# Patient Record
Sex: Female | Born: 1935
Health system: Southern US, Community
[De-identification: ages and names within clinical notes are randomized; demographics above are authoritative.]

## PROBLEM LIST (undated history)

## (undated) DIAGNOSIS — I10 Essential (primary) hypertension: Secondary | ICD-10-CM

## (undated) DIAGNOSIS — E079 Disorder of thyroid, unspecified: Secondary | ICD-10-CM

## (undated) DIAGNOSIS — H269 Unspecified cataract: Secondary | ICD-10-CM

## (undated) DIAGNOSIS — J449 Chronic obstructive pulmonary disease, unspecified: Secondary | ICD-10-CM

## (undated) DIAGNOSIS — G4730 Sleep apnea, unspecified: Secondary | ICD-10-CM

## (undated) HISTORY — PX: OTHER SURGICAL HISTORY: SHX169

## (undated) HISTORY — PX: OVARIAN CYST REMOVAL: SHX89

## (undated) HISTORY — PX: ABDOMINAL HYSTERECTOMY: SHX81

## (undated) HISTORY — PX: TONSILLECTOMY: SUR1361

---

## 2001-10-27 ENCOUNTER — Encounter: Payer: Self-pay | Admitting: Family Medicine

## 2001-10-27 ENCOUNTER — Ambulatory Visit (HOSPITAL_COMMUNITY): Admission: RE | Admit: 2001-10-27 | Discharge: 2001-10-27 | Payer: Self-pay | Admitting: Family Medicine

## 2004-07-20 ENCOUNTER — Ambulatory Visit (HOSPITAL_COMMUNITY): Admission: RE | Admit: 2004-07-20 | Discharge: 2004-07-20 | Payer: Self-pay | Admitting: Family Medicine

## 2004-07-23 ENCOUNTER — Ambulatory Visit (HOSPITAL_COMMUNITY): Admission: RE | Admit: 2004-07-23 | Discharge: 2004-07-23 | Payer: Self-pay | Admitting: Family Medicine

## 2005-11-27 ENCOUNTER — Ambulatory Visit (HOSPITAL_COMMUNITY): Admission: RE | Admit: 2005-11-27 | Discharge: 2005-11-27 | Payer: Self-pay | Admitting: Family Medicine

## 2005-12-01 ENCOUNTER — Emergency Department (HOSPITAL_COMMUNITY): Admission: EM | Admit: 2005-12-01 | Discharge: 2005-12-01 | Payer: Self-pay | Admitting: Emergency Medicine

## 2005-12-16 ENCOUNTER — Ambulatory Visit: Payer: Self-pay | Admitting: Internal Medicine

## 2006-12-27 ENCOUNTER — Emergency Department (HOSPITAL_COMMUNITY): Admission: EM | Admit: 2006-12-27 | Discharge: 2006-12-27 | Payer: Self-pay | Admitting: Emergency Medicine

## 2007-05-07 ENCOUNTER — Ambulatory Visit (HOSPITAL_COMMUNITY): Admission: RE | Admit: 2007-05-07 | Discharge: 2007-05-07 | Payer: Self-pay | Admitting: Family Medicine

## 2008-04-25 ENCOUNTER — Emergency Department (HOSPITAL_COMMUNITY): Admission: EM | Admit: 2008-04-25 | Discharge: 2008-04-25 | Payer: Self-pay | Admitting: Emergency Medicine

## 2008-05-06 ENCOUNTER — Ambulatory Visit (HOSPITAL_COMMUNITY): Admission: RE | Admit: 2008-05-06 | Discharge: 2008-05-06 | Payer: Self-pay | Admitting: Family Medicine

## 2008-08-19 ENCOUNTER — Encounter (INDEPENDENT_AMBULATORY_CARE_PROVIDER_SITE_OTHER): Payer: Self-pay | Admitting: *Deleted

## 2010-07-16 LAB — COMPREHENSIVE METABOLIC PANEL
ALT: 62 U/L — ABNORMAL HIGH (ref 0–35)
AST: 47 U/L — ABNORMAL HIGH (ref 0–37)
Alkaline Phosphatase: 84 U/L (ref 39–117)
CO2: 26 mEq/L (ref 19–32)
Calcium: 9 mg/dL (ref 8.4–10.5)
Chloride: 105 mEq/L (ref 96–112)
GFR calc Af Amer: >60 mL/min (ref >60)
GFR calc non Af Amer: >60 mL/min (ref >60)
Glucose, Bld: 128 mg/dL — ABNORMAL HIGH (ref 70–99)
Potassium: 3.6 mEq/L (ref 3.5–5.1)
Sodium: 136 mEq/L (ref 135–145)
Total Bilirubin: 0.7 mg/dL (ref 0.3–1.2)

## 2010-07-16 LAB — DIFFERENTIAL
Basophils Relative: 0 % (ref 0–1)
Eosinophils Absolute: 0.1 10*3/uL (ref 0.0–0.7)
Eosinophils Relative: 1 % (ref 0–5)
Lymphs Abs: 2.1 10*3/uL (ref 0.7–4.0)
Neutrophils Relative %: 71 % (ref 43–77)

## 2010-07-16 LAB — CBC
Hemoglobin: 15.6 g/dL — ABNORMAL HIGH (ref 12.0–15.0)
MCHC: 33.4 g/dL (ref 30.0–36.0)
RBC: 5.1 MIL/uL (ref 3.87–5.11)
WBC: 9.6 10*3/uL (ref 4.0–10.5)

## 2010-07-16 LAB — D-DIMER, QUANTITATIVE: D-Dimer, Quant: 0.54 ug/mL-FEU — ABNORMAL HIGH (ref 0.00–0.48)

## 2010-08-17 NOTE — Assessment & Plan Note (Signed)
[facility]                               PULMONARY OFFICE NOTE   Stacy Franco, Stacy Franco                    MRN:          [id number]  DATE:12/16/2005                            DOB:          [birth year]    REFERRING PHYSICIAN:  Corrie Mckusick, M.D.   REASON FOR CONSULTATION:  Bronchiectasis.   HISTORY:  A 75 year old white female with a recurrent pattern of bad  bronchitis for over 25 years that tends to occur several times a year, but  typically in the fall with the weather change, associated sometimes with  itching, sneezing and watery nasal discharge, associated with sputum that  turns purulent and eliminated with a round of antibiotics.  In between  exacerbations she says she does fine, with no significant difficulty with  aerobics nor any significant chronic cough, fevers, chills, sweats, chest  pain or leg swelling, unintended weight loss.   PAST MEDICAL HISTORY:  Significant for the absence of significant sinusitis  or childhood asthma.  She does have a history of hyperlipidemia, status post  hysterectomy and appendectomy, and has also had thyroid surgery.   ALLERGIES:  SULFA, CODEINE, PENICILLIN and MORPHINE.   MEDICATIONS:  Zyrtec, Synthroid and metoprolol.   SOCIAL HISTORY:  She has never smoked.  She is retired, with no unusual  travel, pet or hobby exposure history.   FAMILY HISTORY:  Recorded in detail on the work sheet and reviewed with the  patient, and significant for the absence of respiratory diseases or atopy.   REVIEW OF SYSTEMS:  Taken in detail on the work sheet, and significant for  the problems as outlined above.   PHYSICAL EXAMINATION:  This is a pleasant, ambulatory white female with  minimal nasal tone to her voice.  Afebrile, stable vital signs.  HEENT:  Unremarkable.  Oropharynx is clear.  Nasal turbinates are normal.  Ear canals are clear bilaterally.  There was no excessive postnasal drainage  or  cobblestoning.  NECK:  Supple without cervical adenopathy or tenderness.  The trachea was  midline, no thyromegaly.  LUNGS:  Lung fields revealed few rhonchi on FVC only.  CARDIAC:  There is a regular rhythm without murmur, gallop or rub present.  ABDOMEN:  Soft, benign.  No palpable organomegaly, mass or tenderness.  EXTREMITIES:  Warm without calf tenderness, cyanosis, clubbing or edema.   CT scan of the chest was available for review from December 03, 2005.  It  indicates coarse bibasilar atelectasis with bronchiectasis, left greater  than right.  Sinus CT scan was also obtained on December 03, 2005, and shows  turbinate hypertrophy with no evidence of acute or significant chronic  sinusitis.   IMPRESSION:  Classic bronchiectasis masquerading as a seasonal bronchitis.  I pointed out to the patient that patients who have structural lung damage  such as occurs in bronchiectasis have an acquired mucociliary dysfunction  pattern, for which any viral infection or allergic exacerbation may result  in dysfunction of the mucociliary apparatus, leading to a purulent  tracheobronchitis.  I think she has had excellent care, and I would not  recommend  any change in her therapy, but would recommend she return for  pulmonary function tests to see if there is evidence of an upper airflow  obstruction to warrant chronic bronchodilator therapy.  If she is having  frequent exacerbations, I might consider treating her with a drug like  Advair if there is evidence of airflow obstruction, both for the  inflammatory and the bronchospastic component.  In the meantime, any  antibiotic that eradicates the purulent sputum is an adequate antibiotic,  and I would preserve for quinolones for the most severe refractory cases.   In the meantime, she can certainly use Mucinex 2 b.i.d. p.r.n. cough and  congestion.   Followup will be in the context of a pulmonary function test in six weeks.                                    Charlaine Dalton. Sherene Sires, MD, [facility]   MBW/MedQ  DD:  12/17/2005  DT:  12/17/2005  Job #:  [id number]   cc:   Corrie Mckusick, M.D.

## 2010-08-17 NOTE — Procedures (Signed)
Stacy Franco, Stacy Franco             ACCOUNT NO.:  [id number]   MEDICAL RECORD NO.:  [id number]          PATIENT TYPE:  OUT   LOCATION:  RAD                           FACILITY:  [facility]   PHYSICIAN:  Dani Gobble, MD       DATE OF BIRTH:  1935/05/22   DATE OF PROCEDURE:  07/23/2004  DATE OF DISCHARGE:                                  ECHOCARDIOGRAM   REFERRING:  Dr. Phillips Odor and Dr. Domingo Sep.   INDICATIONS:  Edema and shortness breath.   Technical quality of the study is adequate.   The aorta is within normal limits at 3.1 cm.   Left atrium is mildly dilated 4.5 cm. No obvious clots or masses were  appreciated, and the patient appeared to be in sinus rhythm during this  procedure.   The interventricular septum and posterior wall are consistent with mild  concentric LVH with additional moderate basal septal hypertrophy overlay.   The aortic valve was not well visualized but appeared to be grossly  structurally normal with normal leaflet excursion. No significant aortic  insufficiency is noted.   Doppler interrogation of the aortic valve is within normal limits.   Mitral valve appears mildly thickened with normal leaflet excursion. No  mitral valve prolapse is noted. Mild to moderate mitral annular  calcification is noted. No significant mitral regurgitation is noted.   The pulmonic valve is incompletely visualized but appeared to be grossly  structurally normal.   Mild pulmonic insufficiency was noted.   Tricuspid valve was not well visualized but again appeared grossly  structurally normal. No significant tricuspid regurgitation was noted.   The left ventricle is normal in size. The LVIDD measured 4.0 cm and LVISD  measured 2.9 cm. Overall left ventricular systolic function is normal. No  regional wall motion abnormalities were appreciated.   The right ventricle appeared somewhat generous but with normal right  ventricular systolic function. The right atrium appeared normal  in size.  There did appear to be mild diastolic dysfunction.   IMPRESSION:  1.  Mild left atrial enlargement.  2.  Mild concentric left ventricular hypertrophy with additional moderate      basal septal hypertrophy overlay.  3.  Mild thickening of mitral valve was normal leaflet excursion.  4.  Mild to moderate mitral annular calcification.  5.  Mild pulmonic insufficiency.  6.  Normal left size and systolic function without regional wall motion      abnormality.  7.  Mild diastolic dysfunction.  8.  Mild right ventricular enlargement with preserved right ventricular      systolic function.      AB/MEDQ  D:  07/23/2004  T:  07/24/2004  Job:  [id number]

## 2011-01-10 LAB — CBC
HCT: 44.7
Hemoglobin: 15.2 — ABNORMAL HIGH
MCHC: 33.9
MCV: 91
Platelets: 204
RBC: 4.92
RDW: 14.4 — ABNORMAL HIGH
WBC: 8.2

## 2011-01-10 LAB — DIFFERENTIAL
Basophils Absolute: 0
Basophils Relative: 0
Eosinophils Absolute: 0.1
Eosinophils Relative: 1
Lymphocytes Relative: 25
Lymphs Abs: 2.1
Monocytes Absolute: 0.5
Monocytes Relative: 7
Neutro Abs: 5.5
Neutrophils Relative %: 67

## 2011-01-10 LAB — BASIC METABOLIC PANEL
BUN: 12
Calcium: 8.9
Chloride: 105
Creatinine, Ser: 0.74
GFR calc Af Amer: >60

## 2011-01-10 LAB — BASIC METABOLIC PANEL WITH GFR
CO2: 27
GFR calc non Af Amer: >60
Glucose, Bld: 128 — ABNORMAL HIGH
Potassium: 3.8
Sodium: 137

## 2011-11-03 ENCOUNTER — Emergency Department (HOSPITAL_COMMUNITY): Payer: Medicare Other

## 2011-11-03 ENCOUNTER — Emergency Department (HOSPITAL_COMMUNITY)
Admission: EM | Admit: 2011-11-03 | Discharge: 2011-11-03 | Disposition: A | Discharge status: Home / Self Care | Payer: Medicare Other | Attending: Emergency Medicine | Admitting: Emergency Medicine

## 2011-11-03 ENCOUNTER — Encounter (HOSPITAL_COMMUNITY): Payer: Self-pay | Admitting: Emergency Medicine

## 2011-11-03 DIAGNOSIS — J3489 Other specified disorders of nose and nasal sinuses: Secondary | ICD-10-CM | POA: Insufficient documentation

## 2011-11-03 DIAGNOSIS — M25519 Pain in unspecified shoulder: Secondary | ICD-10-CM

## 2011-11-03 DIAGNOSIS — E079 Disorder of thyroid, unspecified: Secondary | ICD-10-CM | POA: Insufficient documentation

## 2011-11-03 DIAGNOSIS — G4730 Sleep apnea, unspecified: Secondary | ICD-10-CM | POA: Insufficient documentation

## 2011-11-03 DIAGNOSIS — E119 Type 2 diabetes mellitus without complications: Secondary | ICD-10-CM | POA: Insufficient documentation

## 2011-11-03 DIAGNOSIS — M549 Dorsalgia, unspecified: Secondary | ICD-10-CM

## 2011-11-03 HISTORY — DX: Essential (primary) hypertension: I10

## 2011-11-03 HISTORY — DX: Unspecified cataract: H26.9

## 2011-11-03 HISTORY — DX: Disorder of thyroid, unspecified: E07.9

## 2011-11-03 HISTORY — DX: Sleep apnea, unspecified: G47.30

## 2011-11-03 LAB — URINALYSIS, ROUTINE W REFLEX MICROSCOPIC
Leukocytes, UA: NEGATIVE
Nitrite: NEGATIVE
Protein, ur: NEGATIVE mg/dL
Specific Gravity, Urine: <1.005 — ABNORMAL LOW (ref 1.005–1.030)
Urobilinogen, UA: 0.2 mg/dL (ref 0.0–1.0)

## 2011-11-03 MED ORDER — ACETAMINOPHEN 325 MG PO TABS
650.0000 mg | ORAL_TABLET | Freq: Once | ORAL | Status: AC
  Administered 2011-11-03: 650 mg via ORAL
  Filled 2011-11-03: qty 2

## 2011-11-03 NOTE — ED Provider Notes (Signed)
History  This chart was scribed for Joya Gaskins, MD by Erskine Emery. This patient was seen in room APA12/APA12 and the patient's care was started at 13:26.   CSN: [id number]  Arrival date & time 11/03/11  1224   First MD Initiated Contact with Patient 11/03/11 1326      Chief Complaint  Patient presents with  . Back Pain  . Nasal Congestion     HPI Stacy Franco is a 76 y.o. female who presents to the Emergency Department complaining of moderate left shoulder pain, congestion (eased by her productive cough), and a mild fever for the past 2-3 days. Pt denies any diarrhea, dysuria, abdominal pain, or chest pain. Pt reports she fell from the bed onto that shoulder about a month ago. She was seen by her doctor after the fall and no fracture was found. Pt reports she has been using muscle relaxer creme that temporarily relieves the pain. Pt has no h/o surgery in this shoulder. Pt also reports some intermittent lower right back pain. Pt reports she is on lasix and has strips to test for UTI that she took when the back pain began again and she had urinary frequency. Pt reports the test came back positive and since she has been drinking a lot of cranberry juice and water. Pt reports the back pain has now eased up. Pt has a h/o arthritis. Pt is not on any antibiotics but uses oxygen at night.  Dr. Phillips Odor is the pt's PCP.    Past Medical History  Diagnosis Date  . Hypertension   . Thyroid disease   . Sleep apnea   . Diabetes mellitus   . Cataracts, bilateral     Past Surgical History  Procedure Date  . Abdominal hysterectomy   . Ovarian cyst removal   . Bunyun   . Eye implants   . Tonsillectomy     History reviewed. No pertinent family history.  History  Substance Use Topics  . Smoking status: Never Smoker   . Smokeless tobacco: Not on file  . Alcohol Use: No    OB History    Grav Para Term Preterm Abortions TAB SAB Ect Mult Living                  Review of  Systems A complete 10 system review of systems was obtained and all systems are negative except as noted in the HPI and PMH.    Allergies  Morphine and related; Penicillins; Shellfish allergy; Sulfa antibiotics; and Codeine  Home Medications   Current Outpatient Rx  Name Route Sig Dispense Refill  . ACETAMINOPHEN 500 MG PO TABS Oral Take 500 mg by mouth every 6 (six) hours as needed. For pain    . CALCIUM CARBONATE-VITAMIN D 500-200 MG-UNIT PO TABS Oral Take 1 tablet by mouth daily.    . FUROSEMIDE 40 MG PO TABS Oral Take 40 mg by mouth daily as needed. For excess fluid    . HYPROMELLOSE 2.5 % OP SOLN Both Eyes Place 1 drop into both eyes 3 (three) times daily as needed. For dry eyes    . LEVOTHYROXINE SODIUM 88 MCG PO TABS Oral Take 88 mcg by mouth daily.    Marland Kitchen MAGNESIUM PO Oral Take 1 tablet by mouth daily.    Marland Kitchen MECLIZINE HCL 25 MG PO TABS Oral Take 25 mg by mouth 3 (three) times daily as needed. For dizziness    . METOPROLOL TARTRATE 50 MG PO TABS Oral Take  50 mg by mouth 2 (two) times daily.    . ADULT MULTIVITAMIN W/MINERALS CH Oral Take 1 tablet by mouth daily.    Marland Kitchen ZINC PO Oral Take 1 tablet by mouth daily.      Triage Vitals: BP 165/93  Pulse 57  Temp 98.3 F (36.8 C) (Oral)  Resp 20  Ht 5' (1.524 m)  Wt 198 lb (89.812 kg)  BMI 38.67 kg/m2  SpO2 97%  Physical Exam CONSTITUTIONAL: Well developed/well nourished HEAD AND FACE: Normocephalic/atraumatic EYES: EOMI/PERRL ENMT: Mucous membranes moist NECK: supple no meningeal signs SPINE:entire spine nontender, No bruising/crepitance/stepoffs noted to spine CV: S1/S2 noted, no murmurs/rubs/gallops noted LUNGS: Lungs are clear to auscultation bilaterally, no apparent distress ABDOMEN: soft, nontender, no rebound or guarding GU:no cva tenderness NEURO: Pt is awake/alert, moves all extremitiesx4, no focal motor deficits in the lower extremities EXTREMITIES: pulses normal, tenderness to anterior shoulder and trapezius but no  deformity noted.  No erythema/edema to left shoulder.  All other extremities/joints palpated/ranged and nontender.  No signs of DVT or cellulitis/septic joint SKIN: warm, color normal PSYCH: no abnormalities of mood noted   ED Course  Procedures  DIAGNOSTIC STUDIES: Oxygen Saturation is 97% on room air, adequate by my interpretation.    COORDINATION OF CARE: 14:15--I evaluated the patient and we discussed a treatment plan including chest x-ray, Tylenol, and EKG to which the pt agreed.   14:30--Medication order: Acetaminophen (Tylenol) tablet 650 mg--once   3:34 PM Pt well appearing CXR negative She has no focal neuro deficits.   Denies CP and no SOB reported.   She has focal shoulder tenderness, doubt ACS Also, no uti noted, but culture sent.  She has no focal neuro deficits in her lower extremities.    Advised f/u with PCP if no improvement in past within a week We discussed strict return precautions   Labs Reviewed  URINALYSIS, ROUTINE W REFLEX MICROSCOPIC      MDM  Nursing notes including past medical history and social history reviewed and considered in documentation Labs/vital reviewed and considered xrays reviewed and considered     Date: 11/03/2011  Rate: 50  Rhythm: sinus bradycardia  QRS Axis: normal  Intervals: normal  ST/T Wave abnormalities: normal  Conduction Disutrbances:none  Narrative Interpretation:   Old EKG Reviewed: unchanged     I personally performed the services described in this documentation, which was scribed in my presence. The recorded information has been reviewed and considered.      Joya Gaskins, MD 11/03/11 1535

## 2011-11-03 NOTE — ED Notes (Addendum)
Pt c/o intermittant R lower back/hip pain, L shoulder pain and congestion x 2 days. No congestion/sob/resp distress noted. States coughed up yellow sputum. Pain to shoulder worse with movement. Denies gu sx's but states took 2 OTC tests for UTI and both were positive

## 2011-11-05 LAB — URINE CULTURE

## 2013-07-20 ENCOUNTER — Other Ambulatory Visit (HOSPITAL_COMMUNITY): Payer: Self-pay | Admitting: Family Medicine

## 2013-07-20 ENCOUNTER — Ambulatory Visit (HOSPITAL_COMMUNITY)
Admission: RE | Admit: 2013-07-20 | Discharge: 2013-07-20 | Disposition: A | Discharge status: Home / Self Care | Payer: Medicare HMO | Source: Ambulatory Visit | Attending: Family Medicine | Admitting: Family Medicine

## 2013-07-20 DIAGNOSIS — R042 Hemoptysis: Secondary | ICD-10-CM

## 2014-02-22 ENCOUNTER — Encounter (HOSPITAL_COMMUNITY): Payer: Self-pay | Admitting: Emergency Medicine

## 2014-02-22 ENCOUNTER — Emergency Department (HOSPITAL_COMMUNITY)
Admission: EM | Admit: 2014-02-22 | Discharge: 2014-02-22 | Disposition: A | Discharge status: Home / Self Care | Payer: Medicare HMO | Attending: Emergency Medicine | Admitting: Emergency Medicine

## 2014-02-22 ENCOUNTER — Emergency Department (HOSPITAL_COMMUNITY): Payer: Medicare HMO

## 2014-02-22 DIAGNOSIS — Z8669 Personal history of other diseases of the nervous system and sense organs: Secondary | ICD-10-CM | POA: Insufficient documentation

## 2014-02-22 DIAGNOSIS — I10 Essential (primary) hypertension: Secondary | ICD-10-CM | POA: Insufficient documentation

## 2014-02-22 DIAGNOSIS — Z7952 Long term (current) use of systemic steroids: Secondary | ICD-10-CM | POA: Insufficient documentation

## 2014-02-22 DIAGNOSIS — J40 Bronchitis, not specified as acute or chronic: Secondary | ICD-10-CM | POA: Diagnosis not present

## 2014-02-22 DIAGNOSIS — Z79899 Other long term (current) drug therapy: Secondary | ICD-10-CM | POA: Diagnosis not present

## 2014-02-22 DIAGNOSIS — R0602 Shortness of breath: Secondary | ICD-10-CM | POA: Insufficient documentation

## 2014-02-22 DIAGNOSIS — E119 Type 2 diabetes mellitus without complications: Secondary | ICD-10-CM | POA: Insufficient documentation

## 2014-02-22 DIAGNOSIS — R059 Cough, unspecified: Secondary | ICD-10-CM

## 2014-02-22 DIAGNOSIS — E079 Disorder of thyroid, unspecified: Secondary | ICD-10-CM | POA: Diagnosis not present

## 2014-02-22 DIAGNOSIS — Z880 Allergy status to penicillin: Secondary | ICD-10-CM | POA: Diagnosis not present

## 2014-02-22 DIAGNOSIS — R05 Cough: Secondary | ICD-10-CM

## 2014-02-22 DIAGNOSIS — Z792 Long term (current) use of antibiotics: Secondary | ICD-10-CM | POA: Insufficient documentation

## 2014-02-22 LAB — CBC WITH DIFFERENTIAL/PLATELET
BASOS ABS: 0 10*3/uL (ref 0.0–0.1)
BASOS PCT: 0 % (ref 0–1)
EOS ABS: 0.1 10*3/uL (ref 0.0–0.7)
Eosinophils Relative: 1 % (ref 0–5)
HCT: 45.6 % (ref 36.0–46.0)
Hemoglobin: 15.3 g/dL — ABNORMAL HIGH (ref 12.0–15.0)
Lymphocytes Relative: 22 % (ref 12–46)
Lymphs Abs: 2.4 10*3/uL (ref 0.7–4.0)
MCH: 30.1 pg (ref 26.0–34.0)
MCHC: 33.6 g/dL (ref 30.0–36.0)
MCV: 89.6 fL (ref 78.0–100.0)
MONO ABS: 1 10*3/uL (ref 0.1–1.0)
Monocytes Relative: 10 % (ref 3–12)
NEUTROS PCT: 67 % (ref 43–77)
Neutro Abs: 7.1 10*3/uL (ref 1.7–7.7)
Platelets: 196 10*3/uL (ref 150–400)
RBC: 5.09 MIL/uL (ref 3.87–5.11)
RDW: 14 % (ref 11.5–15.5)
WBC: 10.6 10*3/uL — ABNORMAL HIGH (ref 4.0–10.5)

## 2014-02-22 LAB — COMPREHENSIVE METABOLIC PANEL
ALBUMIN: 3.7 g/dL (ref 3.5–5.2)
ALT: 37 U/L — ABNORMAL HIGH (ref 0–35)
ANION GAP: 14 (ref 5–15)
AST: 30 U/L (ref 0–37)
Alkaline Phosphatase: 83 U/L (ref 39–117)
BILIRUBIN TOTAL: 0.5 mg/dL (ref 0.3–1.2)
BUN: 15 mg/dL (ref 6–23)
CHLORIDE: 97 meq/L (ref 96–112)
CO2: 25 mEq/L (ref 19–32)
CREATININE: 0.77 mg/dL (ref 0.50–1.10)
Calcium: 9.4 mg/dL (ref 8.4–10.5)
GFR calc Af Amer: >90 mL/min (ref >90)
GFR calc non Af Amer: 80 mL/min — ABNORMAL LOW (ref >90)
Glucose, Bld: 96 mg/dL (ref 70–99)
Potassium: 4.2 mEq/L (ref 3.7–5.3)
Sodium: 136 mEq/L — ABNORMAL LOW (ref 137–147)
TOTAL PROTEIN: 7.7 g/dL (ref 6.0–8.3)

## 2014-02-22 LAB — URINALYSIS, ROUTINE W REFLEX MICROSCOPIC
Bilirubin Urine: NEGATIVE
Glucose, UA: NEGATIVE mg/dL
HGB URINE DIPSTICK: NEGATIVE
Ketones, ur: NEGATIVE mg/dL
Leukocytes, UA: NEGATIVE
Nitrite: NEGATIVE
Protein, ur: NEGATIVE mg/dL
SPECIFIC GRAVITY, URINE: 1.015 (ref 1.005–1.030)
UROBILINOGEN UA: 0.2 mg/dL (ref 0.0–1.0)
pH: 7 (ref 5.0–8.0)

## 2014-02-22 LAB — TROPONIN I: Troponin I: <0.3 ng/mL (ref <0.30)

## 2014-02-22 LAB — PRO B NATRIURETIC PEPTIDE: Pro B Natriuretic peptide (BNP): 139.8 pg/mL (ref 0–450)

## 2014-02-22 LAB — D-DIMER, QUANTITATIVE (NOT AT ARMC): D DIMER QUANT: 0.84 ug{FEU}/mL — AB (ref 0.00–0.48)

## 2014-02-22 MED ORDER — IOHEXOL 350 MG/ML SOLN
100.0000 mL | Freq: Once | INTRAVENOUS | Status: AC | PRN
  Administered 2014-02-22: 100 mL via INTRAVENOUS

## 2014-02-22 MED ORDER — IPRATROPIUM-ALBUTEROL 0.5-2.5 (3) MG/3ML IN SOLN
3.0000 mL | Freq: Once | RESPIRATORY_TRACT | Status: AC
  Administered 2014-02-22: 3 mL via RESPIRATORY_TRACT
  Filled 2014-02-22: qty 3

## 2014-02-22 MED ORDER — PREDNISONE 50 MG PO TABS: ORAL_TABLET | ORAL | Status: DC

## 2014-02-22 MED ORDER — PREDNISONE 50 MG PO TABS
60.0000 mg | ORAL_TABLET | Freq: Once | ORAL | Status: AC
  Administered 2014-02-22: 60 mg via ORAL
  Filled 2014-02-22 (×2): qty 1

## 2014-02-22 NOTE — ED Notes (Signed)
Up for ambulation with pulse ox, tolerated walk to nurses station and back w/o difficulty. Sats remained 96-97% pt denies SOB or weakness

## 2014-02-22 NOTE — ED Provider Notes (Signed)
CSN: [id number]     Arrival date & time 02/22/14  1709 History  This chart was scribed for Ezequiel Essex, MD by Hilda Lias, ED Scribe. This patient was seen in room APA11/APA11 and the patient's care was started at 5:24 PM.    Chief Complaint  Patient presents with  . Shortness of Breath     The history is provided by the patient. No language interpreter was used.     HPI Comments: Stacy Franco is a 78 y.o. female with chronic bronchitis who presents to the Emergency Department complaining of intermittent, worsening SOB with associated nasal congestion and productive cough that began a few weeks ago. Pt states that her current symptoms are exacerbated by episodes of coughing. Pt notes that she has been coughing up sputum with a yellow tinge. Pt notes that she uses a nebulizer treatment, but only when coughing episodes occur. Pt uses Oxygen while sleeping. Pt states that saw a provider yesterday for her current symptoms, and notes that they have gotten worse since yesterday. Pt also states that she has trouble breathing when she lays down flat. Pt notes that she is hypoglycemic but denies any history of heart problems. Pt is not a smoker, and denies stomach pain, vomiting, or chest pain.       Past Medical History  Diagnosis Date  . Hypertension   . Thyroid disease   . Sleep apnea   . Diabetes mellitus   . Cataracts, bilateral    Past Surgical History  Procedure Laterality Date  . Abdominal hysterectomy    . Ovarian cyst removal    . Bunyun    . Eye implants    . Tonsillectomy     Family History  Problem Relation Age of Onset  . Cirrhosis Brother   . Lung cancer Brother   . Colon cancer Mother    History  Substance Use Topics  . Smoking status: Never Smoker   . Smokeless tobacco: Never Used  . Alcohol Use: No   OB History    Gravida Para Term Preterm AB TAB SAB Ectopic Multiple Living   5 5 5       5      Review of Systems  HENT: Positive for congestion.    Respiratory: Positive for cough and shortness of breath.   Cardiovascular: Negative for chest pain.  Gastrointestinal: Negative for vomiting and abdominal pain.    A complete 10 system review of systems was obtained and all systems are negative except as noted in the HPI and PMH.    Allergies  Morphine and related; Penicillins; Shellfish allergy; Sulfa antibiotics; and Codeine  Home Medications   Prior to Admission medications   Medication Sig Start Date End Date Taking? Authorizing Provider  acetaminophen (TYLENOL) 500 MG tablet Take 500 mg by mouth every 6 (six) hours as needed. For pain   Yes Historical Provider, MD  calcium-vitamin D (OSCAL WITH D) 500-200 MG-UNIT per tablet Take 1 tablet by mouth daily.   Yes Historical Provider, MD  furosemide (LASIX) 20 MG tablet Take 20 mg by mouth daily as needed for fluid.   Yes Historical Provider, MD  guaiFENesin (MUCINEX) 600 MG 12 hr tablet Take 600 mg by mouth daily as needed for cough or to loosen phlegm.   Yes Historical Provider, MD  levofloxacin (LEVAQUIN) 750 MG tablet Take 750 mg by mouth daily. 7 day course starting on 02/21/2014   Yes Historical Provider, MD  levothyroxine (SYNTHROID, LEVOTHROID) 112 MCG tablet Take  112 mcg by mouth daily before breakfast.   Yes Historical Provider, MD  MAGNESIUM PO Take 1 tablet by mouth daily.   Yes Historical Provider, MD  meclizine (ANTIVERT) 25 MG tablet Take 25 mg by mouth 3 (three) times daily as needed. For dizziness   Yes Historical Provider, MD  metoprolol (LOPRESSOR) 50 MG tablet Take 25 mg by mouth 2 (two) times daily.    Yes Historical Provider, MD  montelukast (SINGULAIR) 10 MG tablet Take 10 mg by mouth daily.   Yes Historical Provider, MD  potassium gluconate 595 MG TABS tablet Take 595 mg by mouth daily.   Yes Historical Provider, MD  hydroxypropyl methylcellulose (ISOPTO TEARS) 2.5 % ophthalmic solution Place 1 drop into both eyes 3 (three) times daily as needed. For dry eyes     Historical Provider, MD  predniSONE (DELTASONE) 50 MG tablet 1 tablet PO daily 02/22/14   Ezequiel Essex, MD   BP 148/77 mmHg  Pulse 67  Temp(Src) 97.9 F (36.6 C) (Oral)  Resp 13  Ht 5' (1.524 m)  Wt 201 lb (91.173 kg)  BMI 39.26 kg/m2  SpO2 93% Physical Exam  Constitutional: She is oriented to person, place, and time. She appears well-developed and well-nourished. No distress.  Hoarse voice  No distress  HENT:  Head: Normocephalic and atraumatic.  Mouth/Throat: Oropharynx is clear and moist. No oropharyngeal exudate.  Eyes: Conjunctivae and EOM are normal. Pupils are equal, round, and reactive to light.  Neck: Normal range of motion. Neck supple.  No meningismus.  Cardiovascular: Normal rate, regular rhythm, normal heart sounds and intact distal pulses.   No murmur heard. Pulmonary/Chest: Effort normal and breath sounds normal. No respiratory distress.  Lungs clear Fair air exchange  Abdominal: Soft. There is no tenderness. There is no rebound and no guarding.  Musculoskeletal: Normal range of motion. She exhibits no edema or tenderness.  No leg swelling  Neurological: She is alert and oriented to person, place, and time. No cranial nerve deficit. She exhibits normal muscle tone. Coordination normal.  No ataxia on finger to nose bilaterally. No pronator drift. 5/5 strength throughout. CN 2-12 intact. Negative Romberg. Equal grip strength. Sensation intact. Gait is normal.   Skin: Skin is warm.  Psychiatric: She has a normal mood and affect. Her behavior is normal.  Nursing note and vitals reviewed.   ED Course  Procedures (including critical care time)  DIAGNOSTIC STUDIES: Oxygen Saturation is 97% on RA, normal by my interpretation.    COORDINATION OF CARE: 5:30 PM Discussed treatment plan with pt at bedside and pt agreed to plan.   Labs Review Labs Reviewed  CBC WITH DIFFERENTIAL - Abnormal; Notable for the following:    WBC 10.6 (*)    Hemoglobin 15.3 (*)     All other components within normal limits  COMPREHENSIVE METABOLIC PANEL - Abnormal; Notable for the following:    Sodium 136 (*)    ALT 37 (*)    GFR calc non Af Amer 80 (*)    All other components within normal limits  D-DIMER, QUANTITATIVE - Abnormal; Notable for the following:    D-Dimer, Quant 0.84 (*)    All other components within normal limits  PRO B NATRIURETIC PEPTIDE  TROPONIN I  URINALYSIS, ROUTINE W REFLEX MICROSCOPIC  TROPONIN I    Imaging Review Dg Chest 2 View  02/22/2014   CLINICAL DATA:  Cough and asthma.  Bronchitis.  EXAM: CHEST  2 VIEW  COMPARISON:  PA and lateral chest  07/20/2013 and 11/03/2011. CT chest 04/25/2008.  FINDINGS: Lungs are clear. Heart size is normal. No pneumothorax or pleural effusion.  IMPRESSION: No acute disease.   Electronically Signed   By: Inge Rise M.D.   On: 02/22/2014 18:19   Ct Angio Chest Pe W/cm &/or Wo Cm  02/22/2014   CLINICAL DATA:  Shortness of breath.  Productive cough.  EXAM: CT ANGIOGRAPHY CHEST WITH CONTRAST  TECHNIQUE: Multidetector CT imaging of the chest was performed using the standard protocol during bolus administration of intravenous contrast. Multiplanar CT image reconstructions and MIPs were obtained to evaluate the vascular anatomy.  CONTRAST:  100 mL OMNIPAQUE IOHEXOL 350 MG/ML SOLN  COMPARISON:  PA and lateral chest earlier this same day and 07/20/2013.  FINDINGS: No pulmonary embolus is identified. Heart size is normal. Calcific coronary atherosclerosis is noted. Heart size is normal. No pleural or pericardial effusion. There is no axillary, hilar or mediastinal lymphadenopathy. The lungs demonstrate some dependent atelectatic change for are otherwise unremarkable. Visualized upper transient  Visualized upper abdomen shows fatty infiltration of the liver. Single small calcification in the dome of the liver is also identified. No focal bony abnormality is identified.  Review of the MIP images confirms the above  findings.  IMPRESSION: Negative for pulmonary embolus. No finding to explain the patient's symptoms.  Fatty infiltration of liver.  Calcific coronary artery disease.   Electronically Signed   By: Inge Rise M.D.   On: 02/22/2014 19:41     EKG Interpretation None      MDM   Final diagnoses:  Cough  SOB (shortness of breath)  Bronchitis   Worsening shortness of breath for the past 3 days history of chronic bronchitis using oxygen at night. Saw PCP yesterday and given Levaquin which she's had 2 doses. Denies chest pain. Denies any history of COPD or CHF.  No distress. Lungs are clear. CXR negative.  Nebs and steroids given.  D-dimer positive.  CT negative for PE.  EKG unchanged.  Troponin negative x 2.  Ambulatory without desaturation.  No chest pain. No increased work of breathing with ambulation.  Patient has PCP followup tomorrow.  Requesting steroids added to levaquin. Suspect exacerbation of chronic bronchitis.  Has O2 at home for night use. Return to the ED with worsening SOB or chest pain or any other concerns.   Date: 02/22/2014  Rate: 74  Rhythm: normal sinus rhythm  QRS Axis: normal  Intervals: normal  ST/T Wave abnormalities: normal  Conduction Disutrbances:none  Narrative Interpretation:   Old EKG Reviewed: unchanged    I personally performed the services described in this documentation, which was scribed in my presence. The recorded information has been reviewed and is accurate.   Ezequiel Essex, MD 02/22/14 [id number]

## 2014-02-22 NOTE — ED Notes (Signed)
Patient c/o shortness of breath x3 days. Patient wears oxygen only at night but reports having to wear it during the day because she can't get 02 sats up. Patient reports using neb 1.5 hours ago. Patient has chronic bronchitis. Per patient reports being seen PCP yesterday told that she could be developing pneumonia, given antibiotics. Patient reports feeling worse today and running low grade fevers. Denies any chest pain.

## 2014-02-22 NOTE — ED Notes (Signed)
RT made aware of neb tx for pt

## 2014-02-22 NOTE — Discharge Instructions (Signed)
Chronic Asthmatic Bronchitis Continue the antibiotics and steroids. Follow up with your doctor. Use a nebulizer every 4 hours. Return to the ED develop difficulty breathing, chest pain or any other concerns. Chronic asthmatic bronchitis is a complication of persistent asthma. After a period of time with asthma, some people develop airflow obstruction that is present all the time, even when not having an asthma attack.There is also persistent inflammation of the airways, and the bronchial tubes produce more mucus. Chronic asthmatic bronchitis usually is a permanent problem with the lungs. CAUSES  Chronic asthmatic bronchitis happens most often in people who have asthma and also smoke cigarettes. Occasionally, it can happen to a person with long-standing or severe asthma even if the person is not a smoker. SIGNS AND SYMPTOMS  Chronic asthmatic bronchitis usually causes symptoms of both asthma and chronic bronchitis, including:   Coughing.  Increased sputum production.  Wheezing and shortness of breath.  Chest discomfort.  Recurring infections. DIAGNOSIS  Your health care provider will take a medical history and perform a physical exam. Chronic asthmatic bronchitis is suspected when a person with asthma has abnormal results on breathing tests (pulmonary function tests) even when breathing symptoms are at their best. Other tests, such as a chest X-ray, may be performed to rule out other conditions.  TREATMENT  Treatment involves controlling symptoms with medicine and lifestyle changes.  Your health care provider may prescribe asthma medicines, including inhaler and nebulizer medicines.  Infection can be treated with medicine to kill germs (antibiotics). Serious infections may require hospitalization. These can include:  Pneumonia.  Sinus infections.  Acute bronchitis.   Preventing infection and hospitalization is very important. Get an influenza vaccination every year as directed by  your health care provider. Ask your health care provider whether you need a pneumonia vaccine.  Ask your health care provider whether you would benefit from a pulmonary rehabilitation program. HOME CARE INSTRUCTIONS  Take medicines only as directed by your health care provider.  If you are a cigarette smoker, the most important thing that you can do is quit. Talk to your health care provider for help with quitting smoking.  Avoid pollen, dust, animal dander, molds, smoke, and other things that cause attacks.  Regular exercise is very important to help you feel better. Discuss possible exercise routines with your health care provider.  If animal dander is the cause of asthma, you may not be able to keep pets.  It is important that you:  Become educated about your medical condition.  Participate in maintaining wellness.  Seek medical care as directed. Delay in seeking medical care could cause permanent injury and may be a risk to your life. SEEK MEDICAL CARE IF:  You have wheezing and shortness of breath even if taking medicine to prevent attacks.  You have muscle aches, chest pain, or thickening of sputum.  Your sputum changes from clear or white to yellow, green, gray, or bloody. SEEK IMMEDIATE MEDICAL CARE IF:  Your usual medicines do not stop your wheezing.  You have increased coughing or shortness of breath or both.  You have increased difficulty breathing.  You have any problems from the medicine you are taking, such as a rash, itching, swelling, or trouble breathing. MAKE SURE YOU:   Understand these instructions.  Will watch your condition.  Will get help right away if you are not doing well or get worse. Document Released: 01/03/2006 Document Revised: 08/02/2013 Document Reviewed: 04/26/2013 [facility] Patient Information 2015 [place], [place]. This information is not  intended to replace advice given to you by your health care provider. Make sure you discuss any  questions you have with your health care provider.

## 2014-09-16 ENCOUNTER — Other Ambulatory Visit (HOSPITAL_COMMUNITY): Payer: Self-pay | Admitting: Family Medicine

## 2014-09-16 ENCOUNTER — Ambulatory Visit (HOSPITAL_COMMUNITY)
Admission: RE | Admit: 2014-09-16 | Discharge: 2014-09-16 | Disposition: A | Discharge status: Home / Self Care | Payer: Medicare HMO | Source: Ambulatory Visit | Attending: Family Medicine | Admitting: Family Medicine

## 2014-09-16 DIAGNOSIS — R059 Cough, unspecified: Secondary | ICD-10-CM

## 2014-09-16 DIAGNOSIS — R05 Cough: Secondary | ICD-10-CM

## 2014-09-16 DIAGNOSIS — R0602 Shortness of breath: Secondary | ICD-10-CM | POA: Insufficient documentation

## 2014-09-16 DIAGNOSIS — J029 Acute pharyngitis, unspecified: Secondary | ICD-10-CM | POA: Insufficient documentation

## 2014-10-26 ENCOUNTER — Encounter (HOSPITAL_COMMUNITY): Payer: Self-pay | Admitting: Emergency Medicine

## 2014-10-26 ENCOUNTER — Emergency Department (HOSPITAL_COMMUNITY)
Admission: EM | Admit: 2014-10-26 | Discharge: 2014-10-26 | Disposition: A | Discharge status: Home / Self Care | Payer: Medicare HMO | Attending: Emergency Medicine | Admitting: Emergency Medicine

## 2014-10-26 ENCOUNTER — Emergency Department (HOSPITAL_COMMUNITY): Payer: Medicare HMO

## 2014-10-26 DIAGNOSIS — H409 Unspecified glaucoma: Secondary | ICD-10-CM | POA: Diagnosis not present

## 2014-10-26 DIAGNOSIS — R079 Chest pain, unspecified: Secondary | ICD-10-CM | POA: Insufficient documentation

## 2014-10-26 DIAGNOSIS — M791 Myalgia, unspecified site: Secondary | ICD-10-CM

## 2014-10-26 DIAGNOSIS — Z880 Allergy status to penicillin: Secondary | ICD-10-CM | POA: Insufficient documentation

## 2014-10-26 DIAGNOSIS — E079 Disorder of thyroid, unspecified: Secondary | ICD-10-CM | POA: Diagnosis not present

## 2014-10-26 DIAGNOSIS — E119 Type 2 diabetes mellitus without complications: Secondary | ICD-10-CM | POA: Diagnosis not present

## 2014-10-26 DIAGNOSIS — Z79899 Other long term (current) drug therapy: Secondary | ICD-10-CM | POA: Insufficient documentation

## 2014-10-26 DIAGNOSIS — F419 Anxiety disorder, unspecified: Secondary | ICD-10-CM | POA: Diagnosis not present

## 2014-10-26 DIAGNOSIS — Z7952 Long term (current) use of systemic steroids: Secondary | ICD-10-CM | POA: Insufficient documentation

## 2014-10-26 DIAGNOSIS — I10 Essential (primary) hypertension: Secondary | ICD-10-CM | POA: Diagnosis not present

## 2014-10-26 LAB — URINALYSIS, ROUTINE W REFLEX MICROSCOPIC
Bilirubin Urine: NEGATIVE
Glucose, UA: NEGATIVE mg/dL
HGB URINE DIPSTICK: NEGATIVE
KETONES UR: NEGATIVE mg/dL
LEUKOCYTES UA: NEGATIVE
NITRITE: NEGATIVE
PH: 7 (ref 5.0–8.0)
Protein, ur: NEGATIVE mg/dL
UROBILINOGEN UA: 0.2 mg/dL (ref 0.0–1.0)

## 2014-10-26 LAB — COMPREHENSIVE METABOLIC PANEL
ALBUMIN: 4.1 g/dL (ref 3.5–5.0)
ALK PHOS: 68 U/L (ref 38–126)
ALT: 35 U/L (ref 14–54)
ANION GAP: 10 (ref 5–15)
AST: 27 U/L (ref 15–41)
BUN: 17 mg/dL (ref 6–20)
CHLORIDE: 104 mmol/L (ref 101–111)
CO2: 25 mmol/L (ref 22–32)
Calcium: 9.3 mg/dL (ref 8.9–10.3)
Creatinine, Ser: 0.79 mg/dL (ref 0.44–1.00)
GFR calc Af Amer: >60 mL/min (ref >60)
Glucose, Bld: 144 mg/dL — ABNORMAL HIGH (ref 65–99)
Potassium: 4 mmol/L (ref 3.5–5.1)
Sodium: 139 mmol/L (ref 135–145)
Total Bilirubin: 0.7 mg/dL (ref 0.3–1.2)
Total Protein: 7.1 g/dL (ref 6.5–8.1)

## 2014-10-26 LAB — CBC WITH DIFFERENTIAL/PLATELET
Basophils Absolute: 0 10*3/uL (ref 0.0–0.1)
Basophils Relative: 0 % (ref 0–1)
Eosinophils Absolute: 0.2 10*3/uL (ref 0.0–0.7)
Eosinophils Relative: 2 % (ref 0–5)
HCT: 45.4 % (ref 36.0–46.0)
HEMOGLOBIN: 15.3 g/dL — AB (ref 12.0–15.0)
LYMPHS PCT: 30 % (ref 12–46)
Lymphs Abs: 3.1 10*3/uL (ref 0.7–4.0)
MCH: 31.2 pg (ref 26.0–34.0)
MCHC: 33.7 g/dL (ref 30.0–36.0)
MCV: 92.5 fL (ref 78.0–100.0)
Monocytes Absolute: 0.9 10*3/uL (ref 0.1–1.0)
Monocytes Relative: 8 % (ref 3–12)
NEUTROS PCT: 60 % (ref 43–77)
Neutro Abs: 6.1 10*3/uL (ref 1.7–7.7)
PLATELETS: 169 10*3/uL (ref 150–400)
RBC: 4.91 MIL/uL (ref 3.87–5.11)
RDW: 14.6 % (ref 11.5–15.5)
WBC: 10.3 10*3/uL (ref 4.0–10.5)

## 2014-10-26 LAB — TROPONIN I: Troponin I: <0.03 ng/mL (ref <0.031)

## 2014-10-26 MED ORDER — LORAZEPAM 0.5 MG PO TABS: 0.5000 mg | ORAL_TABLET | Freq: Three times a day (TID) | ORAL | Status: DC | PRN

## 2014-10-26 MED ORDER — LORAZEPAM 0.5 MG PO TABS
0.5000 mg | ORAL_TABLET | Freq: Once | ORAL | Status: AC
  Administered 2014-10-26: 0.5 mg via ORAL
  Filled 2014-10-26: qty 1

## 2014-10-26 NOTE — ED Notes (Signed)
Pt alert & oriented x4, stable gait. Patient  given discharge instructions, paperwork & prescription(s). Patient  verbalized understanding. Pt left department in by wheelchair w/ no further questions.

## 2014-10-26 NOTE — ED Notes (Signed)
   10/26/14 0247  Chest Pain Assessment  Occurrence 2 days ago  Chronicity New  Chest Pain Location Left chest  Pain Descriptors / Indicators Sharp  pt states chest pain 2 days ago. States increase stress for the past 2 weeks.

## 2014-10-26 NOTE — ED Notes (Signed)
Pt c/o chest pain and sob with leg pain. Pt states she is under a lot of stress.

## 2014-10-26 NOTE — ED Provider Notes (Signed)
CSN: [id number]     Arrival date & time 10/26/14  0230 History   First MD Initiated Contact with Patient 10/26/14 0244     Chief Complaint  Patient presents with  . Chest Pain     (Consider location/radiation/quality/duration/timing/severity/associated sxs/prior Treatment) Patient is a 79 y.o. female presenting with chest pain. The history is provided by the patient.  Chest Pain She is a somewhat vague historian, but she started complaining of pain in the left side of her chest, left both legs, and abdomen sometime during the afternoon. Pain got worse during the night and she rates pain at 8/10. Nothing makes it better nothing makes it worse. She has difficulty describing the pain. She denies any dyspnea. There was some mild nausea but no vomiting. She has had urinary urgency and frequency. There's been no constipation or diarrhea. She is under a lot of stress with her husband in the hospital and having had coronary artery bypass and valve replacement surgery. She has taken acetaminophen without significant relief of pain. She states that she is very sensitive to any narcotics and is intolerant of NSAIDs.  Past Medical History  Diagnosis Date  . Hypertension   . Thyroid disease   . Sleep apnea   . Diabetes mellitus   . Cataracts, bilateral    Past Surgical History  Procedure Laterality Date  . Abdominal hysterectomy    . Ovarian cyst removal    . Bunyun    . Eye implants    . Tonsillectomy     Family History  Problem Relation Age of Onset  . Cirrhosis Brother   . Lung cancer Brother   . Colon cancer Mother    History  Substance Use Topics  . Smoking status: Never Smoker   . Smokeless tobacco: Never Used  . Alcohol Use: No   OB History    Gravida Para Term Preterm AB TAB SAB Ectopic Multiple Living   5 5 5       5      Review of Systems  Cardiovascular: Positive for chest pain.  All other systems reviewed and are negative.     Allergies  Morphine and related;  Penicillins; Shellfish allergy; Sulfa antibiotics; and Codeine  Home Medications   Prior to Admission medications   Medication Sig Start Date End Date Taking? Authorizing Provider  acetaminophen (TYLENOL) 500 MG tablet Take 500 mg by mouth every 6 (six) hours as needed. For pain   Yes Historical Provider, MD  calcium-vitamin D (OSCAL WITH D) 500-200 MG-UNIT per tablet Take 1 tablet by mouth daily.   Yes Historical Provider, MD  furosemide (LASIX) 20 MG tablet Take 20 mg by mouth daily as needed for fluid.   Yes Historical Provider, MD  hydroxypropyl methylcellulose (ISOPTO TEARS) 2.5 % ophthalmic solution Place 1 drop into both eyes 3 (three) times daily as needed. For dry eyes   Yes Historical Provider, MD  levothyroxine (SYNTHROID, LEVOTHROID) 112 MCG tablet Take 112 mcg by mouth daily before breakfast.   Yes Historical Provider, MD  meclizine (ANTIVERT) 25 MG tablet Take 25 mg by mouth 3 (three) times daily as needed. For dizziness   Yes Historical Provider, MD  metoprolol (LOPRESSOR) 50 MG tablet Take 25 mg by mouth 2 (two) times daily.    Yes Historical Provider, MD  montelukast (SINGULAIR) 10 MG tablet Take 10 mg by mouth daily.   Yes Historical Provider, MD  potassium gluconate 595 MG TABS tablet Take 595 mg by mouth daily.  Yes Historical Provider, MD  guaiFENesin (MUCINEX) 600 MG 12 hr tablet Take 600 mg by mouth daily as needed for cough or to loosen phlegm.    Historical Provider, MD  levofloxacin (LEVAQUIN) 750 MG tablet Take 750 mg by mouth daily. 7 day course starting on 02/21/2014    Historical Provider, MD  MAGNESIUM PO Take 1 tablet by mouth daily.    Historical Provider, MD  predniSONE (DELTASONE) 50 MG tablet 1 tablet PO daily 02/22/14   Ezequiel Essex, MD   BP 166/76 mmHg  Pulse 78  Temp(Src) 98 F (36.7 C)  Resp 20  Ht 5\' 1"  (1.549 m)  Wt 210 lb (95.255 kg)  BMI 39.70 kg/m2  SpO2 94% Physical Exam  Nursing note and vitals reviewed.  79 year old female, resting  comfortably and in no acute distress. Vital signs are significant for hypertension. Oxygen saturation is 94%, which is normal. Head is normocephalic and atraumatic. PERRLA, EOMI. Oropharynx is clear. Neck is nontender and supple without adenopathy or JVD. Back is nontender and there is no CVA tenderness. Lungs are clear without rales, wheezes, or rhonchi. Chestis moderately tender in the left anterior chest wall. Heart has regular rate and rhythm with 1/6  systolic ejection murmur At the upper right sternal border. Abdomen is soft, flat,with mild tenderness diffusely. There are no  masses or hepatosplenomegaly and peristalsis is normoactive. Extremities have no cyanosis or edema, full range of motion is present. Skin is warm and dry without rash. Neurologic: Mental status is normal, cranial nerves are intact, there are no motor or sensory deficits.  ED Course  Procedures (including critical care time) Labs Review Results for orders placed or performed during the hospital encounter of 10/26/14  CBC with Differential  Result Value Ref Range   WBC 10.3 4.0 - 10.5 K/uL   RBC 4.91 3.87 - 5.11 MIL/uL   Hemoglobin 15.3 (H) 12.0 - 15.0 g/dL   HCT 45.4 36.0 - 46.0 %   MCV 92.5 78.0 - 100.0 fL   MCH 31.2 26.0 - 34.0 pg   MCHC 33.7 30.0 - 36.0 g/dL   RDW 14.6 11.5 - 15.5 %   Platelets 169 150 - 400 K/uL   Neutrophils Relative % 60 43 - 77 %   Neutro Abs 6.1 1.7 - 7.7 K/uL   Lymphocytes Relative 30 12 - 46 %   Lymphs Abs 3.1 0.7 - 4.0 K/uL   Monocytes Relative 8 3 - 12 %   Monocytes Absolute 0.9 0.1 - 1.0 K/uL   Eosinophils Relative 2 0 - 5 %   Eosinophils Absolute 0.2 0.0 - 0.7 K/uL   Basophils Relative 0 0 - 1 %   Basophils Absolute 0.0 0.0 - 0.1 K/uL  Comprehensive metabolic panel  Result Value Ref Range   Sodium 139 135 - 145 mmol/L   Potassium 4.0 3.5 - 5.1 mmol/L   Chloride 104 101 - 111 mmol/L   CO2 25 22 - 32 mmol/L   Glucose, Bld 144 (H) 65 - 99 mg/dL   BUN 17 6 - 20 mg/dL    Creatinine, Ser 0.79 0.44 - 1.00 mg/dL   Calcium 9.3 8.9 - 10.3 mg/dL   Total Protein 7.1 6.5 - 8.1 g/dL   Albumin 4.1 3.5 - 5.0 g/dL   AST 27 15 - 41 U/L   ALT 35 14 - 54 U/L   Alkaline Phosphatase 68 38 - 126 U/L   Total Bilirubin 0.7 0.3 - 1.2 mg/dL   GFR  calc non Af Amer >60 >60 mL/min   GFR calc Af Amer >60 >60 mL/min   Anion gap 10 5 - 15  Troponin I  Result Value Ref Range   Troponin I <0.03 <0.031 ng/mL  Urinalysis, Routine w reflex microscopic (not at [facility])  Result Value Ref Range   Color, Urine STRAW (A) YELLOW   APPearance CLEAR CLEAR   Specific Gravity, Urine <1.005 (L) 1.005 - 1.030   pH 7.0 5.0 - 8.0   Glucose, UA NEGATIVE NEGATIVE mg/dL   Hgb urine dipstick NEGATIVE NEGATIVE   Bilirubin Urine NEGATIVE NEGATIVE   Ketones, ur NEGATIVE NEGATIVE mg/dL   Protein, ur NEGATIVE NEGATIVE mg/dL   Urobilinogen, UA 0.2 0.0 - 1.0 mg/dL   Nitrite NEGATIVE NEGATIVE   Leukocytes, UA NEGATIVE NEGATIVE   Imaging Review Dg Chest Portable 1 View  10/26/2014   CLINICAL DATA:  Shortness of breath and sharp chest pain.  EXAM: PORTABLE CHEST - 1 VIEW  COMPARISON:  Frontal and lateral views 09/16/2014  FINDINGS: The cardiomediastinal contours are unchanged. Scarring again seen at the right lung base. Pulmonary vasculature is normal. No consolidation, pleural effusion, or pneumothorax. No acute osseous abnormalities are seen.  IMPRESSION: No acute pulmonary process.   Electronically Signed   By: Jeb Levering M.D.   On: 10/26/2014 03:24     EKG Interpretation   Date/Time:  Wednesday October 26 2014 02:38:58 EDT Ventricular Rate:  80 PR Interval:  155 QRS Duration: 89 QT Interval:  391 QTC Calculation: 451 R Axis:   -40 Text Interpretation:  Sinus rhythm Left axis deviation Anterior infarct,  old When compared with ECG of 02/22/2014, No significant change was found  Confirmed by [facility]  MD,  ([id number]) on 10/26/2014 2:45:06 AM      MDM   Final diagnoses:  Chest pain,  unspecified chest pain type  Myalgia  Anxiety    Generalized aching including abdomen and chest. Pattern is not suggestive of coronary artery disease. Only localizing symptom is urinary frequency, so urinalysis will be obtained to look for UTI. Screening labs are obtained. Chest x-ray will be obtained.  Urinalysis shows no evidence of urinary tract infection. Laboratory workup is unremarkable. However, with just observation in the ED, she is feeling better. Because of her problems with anxiety, she is discharged with a prescription for low-dose lorazepam and is referred back to her PCP.  Delora Fuel, MD [id number]

## 2014-10-26 NOTE — Discharge Instructions (Signed)
Stress Stress-related medical problems are becoming increasingly common. The body has a built-in physical response to stressful situations. Faced with pressure, challenge or danger, we need to react quickly. Our bodies release hormones such as cortisol and adrenaline to help do this. These hormones are part of the "fight or flight" response and affect the metabolic rate, heart rate and blood pressure, resulting in a heightened, stressed state that prepares the body for optimum performance in dealing with a stressful situation. It is likely that early man required these mechanisms to stay alive, but usually modern stresses do not call for this, and the same hormones released in today's world can damage health and reduce coping ability. CAUSES  Pressure to perform at work, at school or in sports.  Threats of physical violence.  Money worries.  Arguments.  Family conflicts.  Divorce or separation from significant other.  Bereavement.  New job or unemployment.  Changes in location.  Alcohol or drug abuse. SOMETIMES, THERE IS NO PARTICULAR REASON FOR DEVELOPING STRESS. Almost all people are at risk of being stressed at some time in their lives. It is important to know that some stress is temporary and some is long term.  Temporary stress will go away when a situation is resolved. Most people can cope with short periods of stress, and it can often be relieved by relaxing, taking a walk or getting any type of exercise, chatting through issues with friends, or having a good night's sleep.  Chronic (long-term, continuous) stress is much harder to deal with. It can be psychologically and emotionally damaging. It can be harmful both for an individual and for friends and family. SYMPTOMS Everyone reacts to stress differently. There are some common effects that help [place] recognize it. In times of extreme stress, people may:  Shake uncontrollably.  Breathe faster and deeper than normal  (hyperventilate).  Vomit.  For people with asthma, stress can trigger an attack.  For some people, stress may trigger migraine headaches, ulcers, and body pain. PHYSICAL EFFECTS OF STRESS MAY INCLUDE:  Loss of energy.  Skin problems.  Aches and pains resulting from tense muscles, including neck ache, backache and tension headaches.  Increased pain from arthritis and other conditions.  Irregular heart beat (palpitations).  Periods of irritability or anger.  Apathy or depression.  Anxiety (feeling uptight or worrying).  Unusual behavior.  Loss of appetite.  Comfort eating.  Lack of concentration.  Loss of, or decreased, sex-drive.  Increased smoking, drinking, or recreational drug use.  For women, missed periods.  Ulcers, joint pain, and muscle pain. Post-traumatic stress is the stress caused by any serious accident, strong emotional damage, or extremely difficult or violent experience such as rape or war. Post-traumatic stress victims can experience mixtures of emotions such as fear, shame, depression, guilt or anger. It may include recurrent memories or images that may be haunting. These feelings can last for weeks, months or even years after the traumatic event that triggered them. Specialized treatment, possibly with medicines and psychological therapies, is available. If stress is causing physical symptoms, severe distress or making it difficult for you to function as normal, it is worth seeing your caregiver. It is important to remember that although stress is a usual part of life, extreme or prolonged stress can lead to other illnesses that will need treatment. It is better to visit a doctor sooner rather than later. Stress has been linked to the development of high blood pressure and heart disease, as well as insomnia and depression.  There is no diagnostic test for stress since everyone reacts to it differently. But a caregiver will be able to spot the physical  symptoms, such as:  Headaches.  Shingles.  Ulcers. Emotional distress such as intense worry, low mood or irritability should be detected when the doctor asks pertinent questions to identify any underlying problems that might be the cause. In case there are physical reasons for the symptoms, the doctor may also want to do some tests to exclude certain conditions. If you feel that you are suffering from stress, try to identify the aspects of your life that are causing it. Sometimes you may not be able to change or avoid them, but even a small change can have a positive ripple effect. A simple lifestyle change can make all the difference. STRATEGIES THAT CAN HELP DEAL WITH STRESS:  Delegating or sharing responsibilities.  Avoiding confrontations.  Learning to be more assertive.  Regular exercise.  Avoid using alcohol or street drugs to cope.  Eating a healthy, balanced diet, rich in fruit and vegetables and proteins.  Finding humor or absurdity in stressful situations.  Never taking on more than you know you can handle comfortably.  Organizing your time better to get as much done as possible.  Talking to friends or family and sharing your thoughts and fears.  Listening to music or relaxation tapes.  Relaxation techniques like deep breathing, meditation, and yoga.  Tensing and then relaxing your muscles, starting at the toes and working up to the head and neck. If you think that you would benefit from help, either in identifying the things that are causing your stress or in learning techniques to help you relax, see a caregiver who is capable of helping you with this. Rather than relying on medications, it is usually better to try and identify the things in your life that are causing stress and try to deal with them. There are many techniques of managing stress including counseling, psychotherapy, aromatherapy, yoga, and exercise. Your caregiver can help you determine what is best  for you. Document Released: 06/08/2002 Document Revised: 03/23/2013 Document Reviewed: 05/05/2007 [facility] Patient Information 2015 [place], [place]. This information is not intended to replace advice given to you by your health care provider. Make sure you discuss any questions you have with your health care provider.  Lorazepam tablets What is this medicine? LORAZEPAM (lor A ze pam) is a benzodiazepine. It is used to treat anxiety. This medicine may be used for other purposes; ask your health care provider or pharmacist if you have questions. COMMON BRAND NAME(S): Ativan What should I tell my health care provider before I take this medicine? They need to know if you have any of these conditions: -alcohol or drug abuse problem -bipolar disorder, depression, psychosis or other mental health condition -glaucoma -kidney or liver disease -lung disease or breathing difficulties -myasthenia gravis -Parkinson's disease -seizures or a history of seizures -suicidal thoughts -an unusual or allergic reaction to lorazepam, other benzodiazepines, foods, dyes, or preservatives -pregnant or trying to get pregnant -breast-feeding How should I use this medicine? Take this medicine by mouth with a glass of water. Follow the directions on the prescription label. If it upsets your stomach, take it with food or milk. Take your medicine at regular intervals. Do not take it more often than directed. Do not stop taking except on the advice of your doctor or health care professional. Talk to your pediatrician regarding the use of this medicine in children. Special care may be  needed. Overdosage: If you think you have taken too much of this medicine contact a poison control center or emergency room at once. NOTE: This medicine is only for you. Do not share this medicine with others. What if I miss a dose? If you miss a dose, take it as soon as you can. If it is almost time for your next dose, take only that dose.  Do not take double or extra doses. What may interact with this medicine? -barbiturate medicines for inducing sleep or treating seizures, like phenobarbital -clozapine -medicines for depression, mental problems or psychiatric disturbances -medicines for sleep -phenytoin -probenecid -theophylline -valproic acid This list may not describe all possible interactions. Give your health care provider a list of all the medicines, herbs, non-prescription drugs, or dietary supplements you use. Also tell them if you smoke, drink alcohol, or use illegal drugs. Some items may interact with your medicine. What should I watch for while using this medicine? Visit your doctor or health care professional for regular checks on your progress. Your body may become dependent on this medicine, ask your doctor or health care professional if you still need to take it. However, if you have been taking this medicine regularly for some time, do not suddenly stop taking it. You must gradually reduce the dose or you may get severe side effects. Ask your doctor or health care professional for advice before increasing or decreasing the dose. Even after you stop taking this medicine it can still affect your body for several days. You may get drowsy or dizzy. Do not drive, use machinery, or do anything that needs mental alertness until you know how this medicine affects you. To reduce the risk of dizzy and fainting spells, do not stand or sit up quickly, especially if you are an older patient. Alcohol may increase dizziness and drowsiness. Avoid alcoholic drinks. Do not treat yourself for coughs, colds or allergies without asking your doctor or health care professional for advice. Some ingredients can increase possible side effects. What side effects may I notice from receiving this medicine? Side effects that you should report to your doctor or health care professional as soon as possible: -changes in  vision -confusion -depression -mood changes, excitability or aggressive behavior -movement difficulty, staggering or jerky movements -muscle cramps -restlessness -weakness or tiredness Side effects that usually do not require medical attention (report to your doctor or health care professional if they continue or are bothersome): -constipation or diarrhea -difficulty sleeping, nightmares -dizziness, drowsiness -headache -nausea, vomiting This list may not describe all possible side effects. Call your doctor for medical advice about side effects. You may report side effects to FDA at 1-800-FDA-1088. Where should I keep my medicine? Keep out of the reach of children. This medicine can be abused. Keep your medicine in a safe place to protect it from theft. Do not share this medicine with anyone. Selling or giving away this medicine is dangerous and against the law. Store at room temperature between 20 and 25 degrees C (68 and 77 degrees F). Protect from light. Keep container tightly closed. Throw away any unused medicine after the expiration date. NOTE: This sheet is a summary. It may not cover all possible information. If you have questions about this medicine, talk to your doctor, pharmacist, or health care provider.  2015, [employer]. (2007-09-18 14:58:20)

## 2015-01-13 DIAGNOSIS — R0902 Hypoxemia: Secondary | ICD-10-CM | POA: Diagnosis not present

## 2015-01-24 DIAGNOSIS — E039 Hypothyroidism, unspecified: Secondary | ICD-10-CM | POA: Diagnosis not present

## 2015-01-24 DIAGNOSIS — Z1389 Encounter for screening for other disorder: Secondary | ICD-10-CM | POA: Diagnosis not present

## 2015-01-24 DIAGNOSIS — Z0000 Encounter for general adult medical examination without abnormal findings: Secondary | ICD-10-CM | POA: Diagnosis not present

## 2015-01-24 DIAGNOSIS — J302 Other seasonal allergic rhinitis: Secondary | ICD-10-CM | POA: Diagnosis not present

## 2015-01-24 DIAGNOSIS — Z6841 Body Mass Index (BMI) 40.0 and over, adult: Secondary | ICD-10-CM | POA: Diagnosis not present

## 2015-01-26 DIAGNOSIS — Z6841 Body Mass Index (BMI) 40.0 and over, adult: Secondary | ICD-10-CM | POA: Diagnosis not present

## 2015-01-26 DIAGNOSIS — E039 Hypothyroidism, unspecified: Secondary | ICD-10-CM | POA: Diagnosis not present

## 2015-01-31 DIAGNOSIS — Z23 Encounter for immunization: Secondary | ICD-10-CM | POA: Diagnosis not present

## 2015-02-13 DIAGNOSIS — R0902 Hypoxemia: Secondary | ICD-10-CM | POA: Diagnosis not present

## 2015-02-20 DIAGNOSIS — R69 Illness, unspecified: Secondary | ICD-10-CM | POA: Diagnosis not present

## 2015-03-15 DIAGNOSIS — R0902 Hypoxemia: Secondary | ICD-10-CM | POA: Diagnosis not present

## 2015-03-17 DIAGNOSIS — R69 Illness, unspecified: Secondary | ICD-10-CM | POA: Diagnosis not present

## 2015-04-15 DIAGNOSIS — R0902 Hypoxemia: Secondary | ICD-10-CM | POA: Diagnosis not present

## 2015-04-20 DIAGNOSIS — Z6839 Body mass index (BMI) 39.0-39.9, adult: Secondary | ICD-10-CM | POA: Diagnosis not present

## 2015-04-20 DIAGNOSIS — Z1389 Encounter for screening for other disorder: Secondary | ICD-10-CM | POA: Diagnosis not present

## 2015-04-20 DIAGNOSIS — E119 Type 2 diabetes mellitus without complications: Secondary | ICD-10-CM | POA: Diagnosis not present

## 2015-04-20 DIAGNOSIS — J069 Acute upper respiratory infection, unspecified: Secondary | ICD-10-CM | POA: Diagnosis not present

## 2015-04-20 DIAGNOSIS — J3089 Other allergic rhinitis: Secondary | ICD-10-CM | POA: Diagnosis not present

## 2015-05-16 DIAGNOSIS — R0902 Hypoxemia: Secondary | ICD-10-CM | POA: Diagnosis not present

## 2015-06-01 DIAGNOSIS — R05 Cough: Secondary | ICD-10-CM | POA: Diagnosis not present

## 2015-06-01 DIAGNOSIS — Z6841 Body Mass Index (BMI) 40.0 and over, adult: Secondary | ICD-10-CM | POA: Diagnosis not present

## 2015-06-01 DIAGNOSIS — Z1389 Encounter for screening for other disorder: Secondary | ICD-10-CM | POA: Diagnosis not present

## 2015-06-01 DIAGNOSIS — J449 Chronic obstructive pulmonary disease, unspecified: Secondary | ICD-10-CM | POA: Diagnosis not present

## 2015-06-13 DIAGNOSIS — R0902 Hypoxemia: Secondary | ICD-10-CM | POA: Diagnosis not present

## 2015-06-15 DIAGNOSIS — Z0100 Encounter for examination of eyes and vision without abnormal findings: Secondary | ICD-10-CM | POA: Diagnosis not present

## 2015-06-15 DIAGNOSIS — E119 Type 2 diabetes mellitus without complications: Secondary | ICD-10-CM | POA: Diagnosis not present

## 2015-06-15 DIAGNOSIS — H5200 Hypermetropia, unspecified eye: Secondary | ICD-10-CM | POA: Diagnosis not present

## 2015-07-14 DIAGNOSIS — R0902 Hypoxemia: Secondary | ICD-10-CM | POA: Diagnosis not present

## 2015-07-28 DIAGNOSIS — E063 Autoimmune thyroiditis: Secondary | ICD-10-CM | POA: Diagnosis not present

## 2015-07-28 DIAGNOSIS — Z6841 Body Mass Index (BMI) 40.0 and over, adult: Secondary | ICD-10-CM | POA: Diagnosis not present

## 2015-07-28 DIAGNOSIS — I10 Essential (primary) hypertension: Secondary | ICD-10-CM | POA: Diagnosis not present

## 2015-07-28 DIAGNOSIS — Z1389 Encounter for screening for other disorder: Secondary | ICD-10-CM | POA: Diagnosis not present

## 2015-07-28 DIAGNOSIS — E782 Mixed hyperlipidemia: Secondary | ICD-10-CM | POA: Diagnosis not present

## 2015-07-28 DIAGNOSIS — R69 Illness, unspecified: Secondary | ICD-10-CM | POA: Diagnosis not present

## 2015-07-31 DIAGNOSIS — E782 Mixed hyperlipidemia: Secondary | ICD-10-CM | POA: Diagnosis not present

## 2015-07-31 DIAGNOSIS — E039 Hypothyroidism, unspecified: Secondary | ICD-10-CM | POA: Diagnosis not present

## 2015-08-13 DIAGNOSIS — R0902 Hypoxemia: Secondary | ICD-10-CM | POA: Diagnosis not present

## 2015-09-13 DIAGNOSIS — R0902 Hypoxemia: Secondary | ICD-10-CM | POA: Diagnosis not present

## 2015-10-13 DIAGNOSIS — R0902 Hypoxemia: Secondary | ICD-10-CM | POA: Diagnosis not present

## 2015-10-19 DIAGNOSIS — I10 Essential (primary) hypertension: Secondary | ICD-10-CM | POA: Diagnosis not present

## 2015-10-19 DIAGNOSIS — Z0000 Encounter for general adult medical examination without abnormal findings: Secondary | ICD-10-CM | POA: Diagnosis not present

## 2015-10-19 DIAGNOSIS — J449 Chronic obstructive pulmonary disease, unspecified: Secondary | ICD-10-CM | POA: Diagnosis not present

## 2015-10-19 DIAGNOSIS — E039 Hypothyroidism, unspecified: Secondary | ICD-10-CM | POA: Diagnosis not present

## 2015-10-23 IMAGING — CR DG CHEST 1V PORT
1 series · 2 of 2 positions shown · non-contrast
Comparison: Frontal and lateral views 09/16/2014

CLINICAL DATA: Shortness of breath and sharp chest pain.

EXAM:
PORTABLE CHEST - 1 VIEW

[Series 1: ap portable · 0.17mm/px · 2 of 2 slices shown]
[im 1/2]
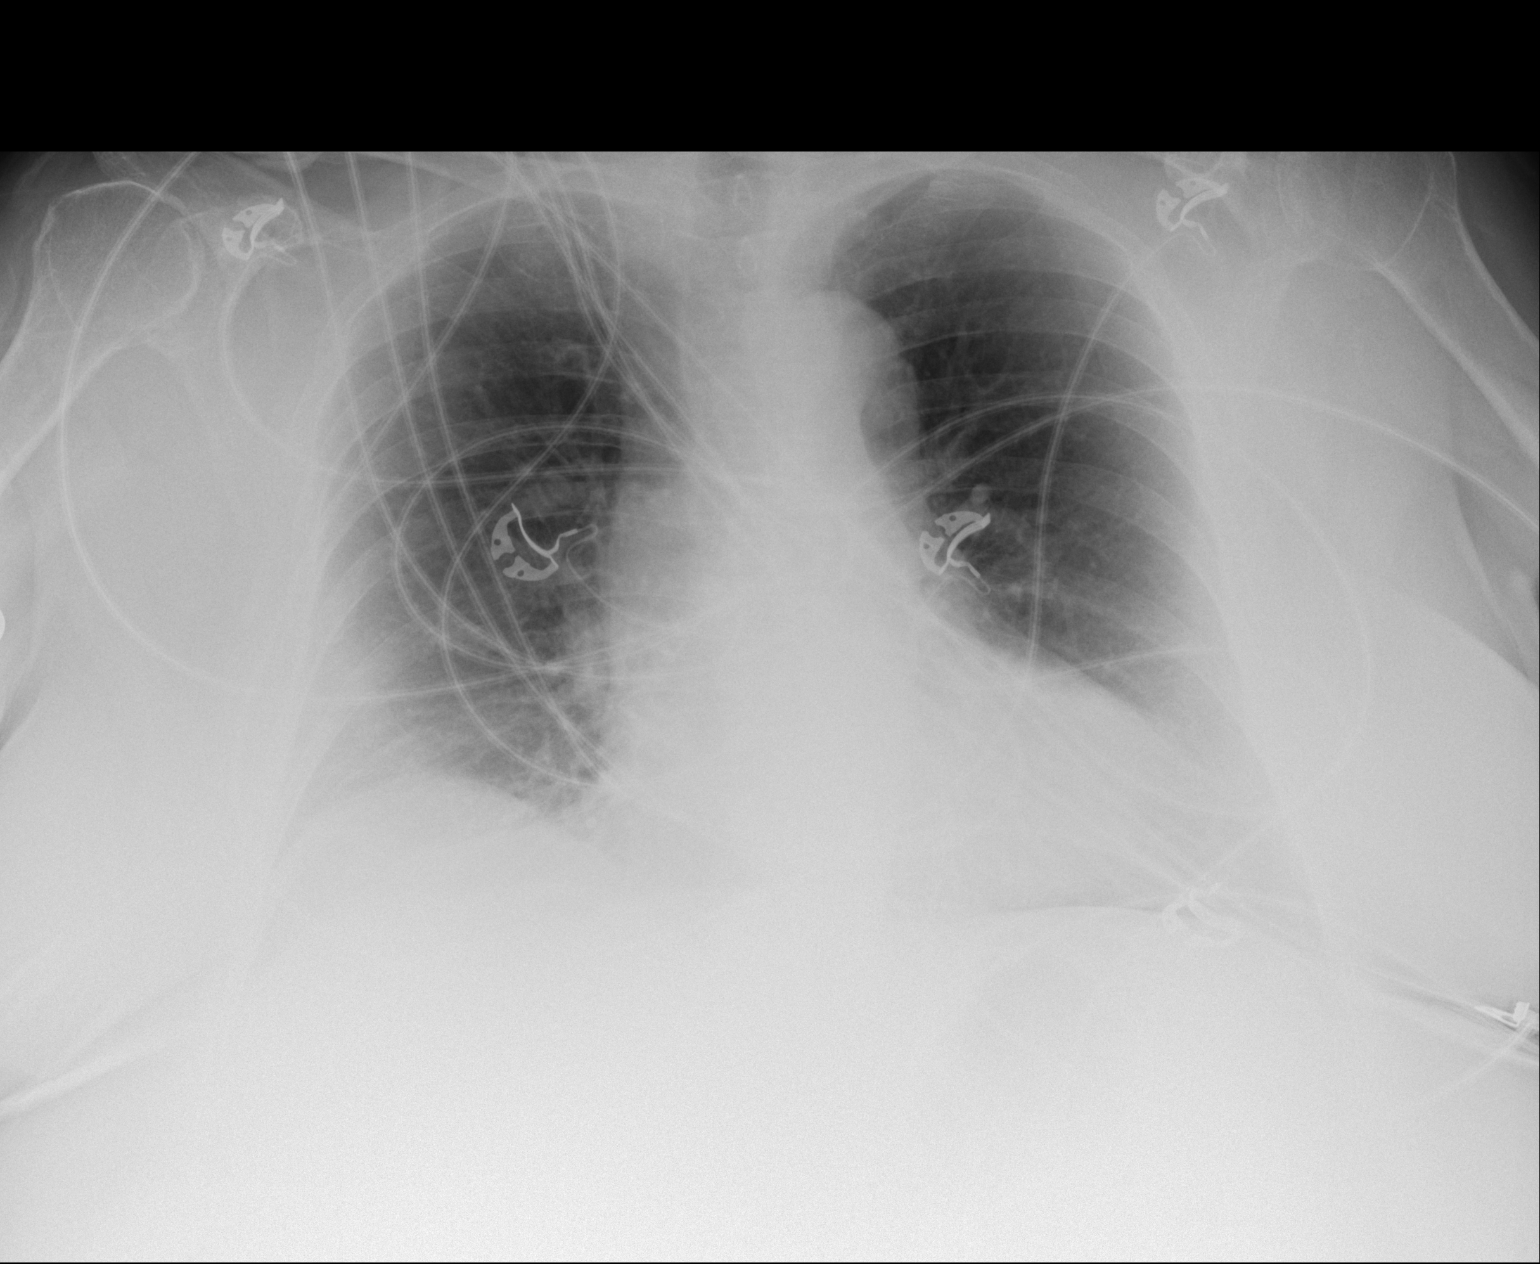
[im 2/2]
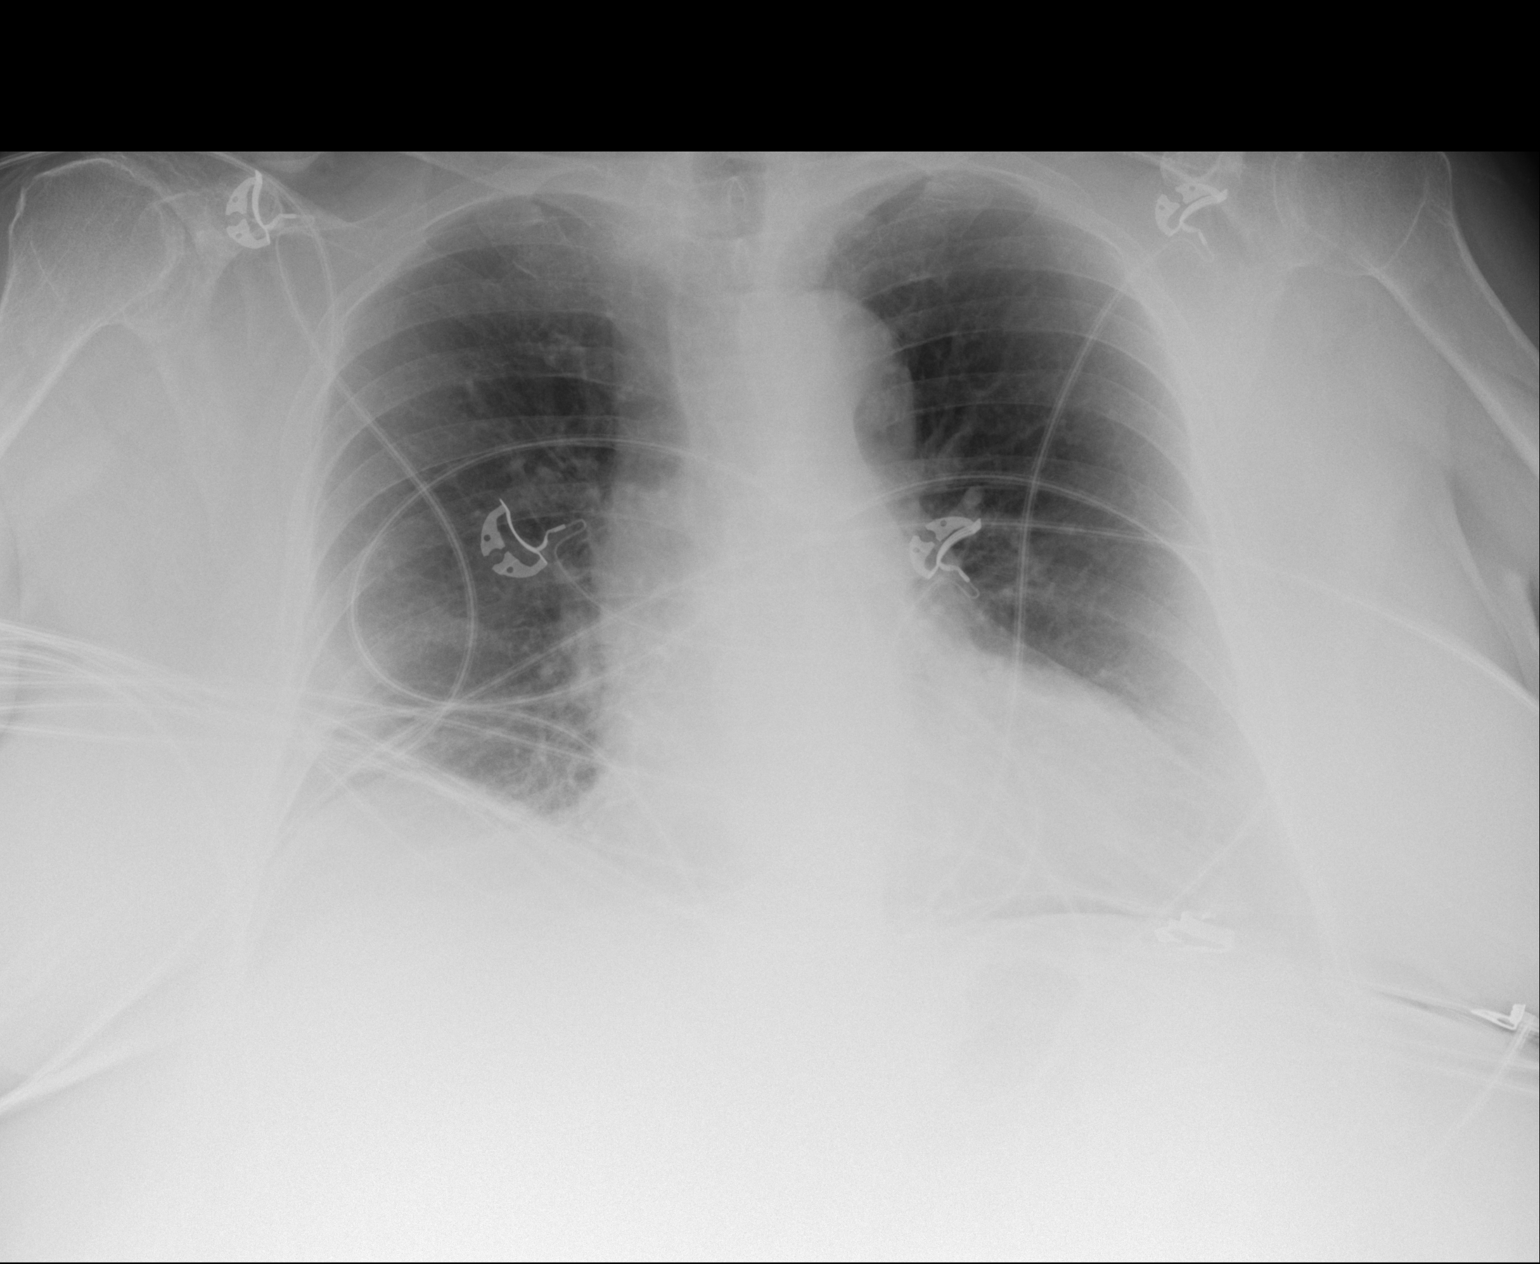

[2 of 2 positions shown; findings below may reference images not displayed]

FINDINGS: The cardiomediastinal contours are unchanged. Scarring again seen at
the right lung base. Pulmonary vasculature is normal. No
consolidation, pleural effusion, or pneumothorax. No acute osseous
abnormalities are seen.
IMPRESSION: No acute pulmonary process.

## 2015-10-31 DIAGNOSIS — J069 Acute upper respiratory infection, unspecified: Secondary | ICD-10-CM | POA: Diagnosis not present

## 2015-10-31 DIAGNOSIS — Z6841 Body Mass Index (BMI) 40.0 and over, adult: Secondary | ICD-10-CM | POA: Diagnosis not present

## 2015-10-31 DIAGNOSIS — J209 Acute bronchitis, unspecified: Secondary | ICD-10-CM | POA: Diagnosis not present

## 2015-11-13 DIAGNOSIS — R0902 Hypoxemia: Secondary | ICD-10-CM | POA: Diagnosis not present

## 2015-12-06 ENCOUNTER — Ambulatory Visit (INDEPENDENT_AMBULATORY_CARE_PROVIDER_SITE_OTHER): Payer: Medicare HMO | Admitting: Orthopedic Surgery

## 2015-12-06 ENCOUNTER — Ambulatory Visit (INDEPENDENT_AMBULATORY_CARE_PROVIDER_SITE_OTHER): Payer: Medicare HMO

## 2015-12-06 ENCOUNTER — Encounter: Payer: Self-pay | Admitting: Orthopedic Surgery

## 2015-12-06 VITALS — BP 176/107 | Ht 60.0 in | Wt 208.0 lb

## 2015-12-06 DIAGNOSIS — M25561 Pain in right knee: Secondary | ICD-10-CM

## 2015-12-06 DIAGNOSIS — M129 Arthropathy, unspecified: Secondary | ICD-10-CM

## 2015-12-06 DIAGNOSIS — M1711 Unilateral primary osteoarthritis, right knee: Secondary | ICD-10-CM

## 2015-12-06 NOTE — Progress Notes (Signed)
No chief complaint on file.  HPI [facility] [place]  Right knee pain 3 weeks No injury 8 out of 10 pain Sharp Stabbing Pain She does get relief with Tylenol  Denied locking catching stiffness giving out numbness or tingling  Review of Systems  Constitutional: Negative for chills, fever and weight loss.  HENT: Positive for congestion.   Respiratory: Negative for shortness of breath.   Cardiovascular: Negative for chest pain.  Musculoskeletal: Positive for back pain and joint pain.  Neurological: Negative for tingling.  Endo/Heme/Allergies: Positive for environmental allergies.  All other systems reviewed and are negative.   Past Medical History:  Diagnosis Date  . Cataracts, bilateral   . Diabetes mellitus   . Hypertension   . Sleep apnea   . Thyroid disease     Past Surgical History:  Procedure Laterality Date  . ABDOMINAL HYSTERECTOMY    . bunyun    . eye implants    . OVARIAN CYST REMOVAL    . TONSILLECTOMY     Family History  Problem Relation Age of Onset  . Cirrhosis Brother   . Lung cancer Brother   . Colon cancer Mother    Social History  Substance Use Topics  . Smoking status: Never Smoker  . Smokeless tobacco: Never Used  . Alcohol use No   No outpatient prescriptions have been marked as taking for the 12/06/15 encounter (Appointment) with Carole Civil, MD.    BP (!) 176/107   Ht 5' (1.524 m)   Wt 208 lb (94.3 kg)   BMI 40.62 kg/m   Physical Exam  Constitutional: She is oriented to person, place, and time. She appears well-developed and well-nourished. No distress.  Cardiovascular: Normal rate and intact distal pulses.   Neurological: She is alert and oriented to person, place, and time. She has normal reflexes. She exhibits normal muscle tone. Coordination normal.  Skin: Skin is warm and dry. No rash noted. She is not diaphoretic. No erythema. No pallor.  Psychiatric: She has a normal mood and affect. Her  behavior is normal. Judgment and thought content normal.    Ortho Exam Left knee looks good no tenderness or swelling. Range of motion is normal. She has no instability and the strength is normal. She has some mild varicose veins no peripheral edema normal sensation  Right knee medial joint line tenderness no effusion knee flexion normal knee stable. Muscle tone strength normal. Skin normal. Pulses normal. No peripheral edema  ASSESSMENT: My personal interpretation of the images:  The x-ray today shows mild arthritis medial compartment  She may have meniscal tear but right now we'll treat her for arthritis  Encounter Diagnoses  Name Primary?  . Right knee pain Yes  . Arthritis of right knee        PLAN Right knee injection  Procedure note right knee injection verbal consent was obtained to inject right knee joint  Timeout was completed to confirm the site of injection  The medications used were 40 mg of Depo-Medrol and 1% lidocaine 3 cc  Anesthesia was provided by ethyl chloride and the skin was prepped with alcohol.  After cleaning the skin with alcohol a 20-gauge needle was used to inject the right knee joint. There were no complications. A sterile bandage was applied.   Arther Abbott, MD 12/06/2015 1:46 PM  .meds

## 2015-12-06 NOTE — Patient Instructions (Signed)

## 2015-12-14 DIAGNOSIS — R0902 Hypoxemia: Secondary | ICD-10-CM | POA: Diagnosis not present

## 2016-01-01 DIAGNOSIS — E782 Mixed hyperlipidemia: Secondary | ICD-10-CM | POA: Diagnosis not present

## 2016-01-01 DIAGNOSIS — I10 Essential (primary) hypertension: Secondary | ICD-10-CM | POA: Diagnosis not present

## 2016-01-01 DIAGNOSIS — Z6841 Body Mass Index (BMI) 40.0 and over, adult: Secondary | ICD-10-CM | POA: Diagnosis not present

## 2016-01-01 DIAGNOSIS — E119 Type 2 diabetes mellitus without complications: Secondary | ICD-10-CM | POA: Diagnosis not present

## 2016-01-01 DIAGNOSIS — Z23 Encounter for immunization: Secondary | ICD-10-CM | POA: Diagnosis not present

## 2016-01-01 DIAGNOSIS — Z1389 Encounter for screening for other disorder: Secondary | ICD-10-CM | POA: Diagnosis not present

## 2016-01-02 DIAGNOSIS — I10 Essential (primary) hypertension: Secondary | ICD-10-CM | POA: Diagnosis not present

## 2016-01-02 DIAGNOSIS — E119 Type 2 diabetes mellitus without complications: Secondary | ICD-10-CM | POA: Diagnosis not present

## 2016-01-02 DIAGNOSIS — Z6841 Body Mass Index (BMI) 40.0 and over, adult: Secondary | ICD-10-CM | POA: Diagnosis not present

## 2016-01-13 DIAGNOSIS — R0902 Hypoxemia: Secondary | ICD-10-CM | POA: Diagnosis not present

## 2016-01-30 DIAGNOSIS — R69 Illness, unspecified: Secondary | ICD-10-CM | POA: Diagnosis not present

## 2016-02-13 DIAGNOSIS — R0902 Hypoxemia: Secondary | ICD-10-CM | POA: Diagnosis not present

## 2016-02-27 DIAGNOSIS — I10 Essential (primary) hypertension: Secondary | ICD-10-CM | POA: Diagnosis not present

## 2016-02-27 DIAGNOSIS — Z6841 Body Mass Index (BMI) 40.0 and over, adult: Secondary | ICD-10-CM | POA: Diagnosis not present

## 2016-02-27 DIAGNOSIS — J302 Other seasonal allergic rhinitis: Secondary | ICD-10-CM | POA: Diagnosis not present

## 2016-02-27 DIAGNOSIS — E039 Hypothyroidism, unspecified: Secondary | ICD-10-CM | POA: Diagnosis not present

## 2016-02-27 DIAGNOSIS — J4520 Mild intermittent asthma, uncomplicated: Secondary | ICD-10-CM | POA: Diagnosis not present

## 2016-02-27 DIAGNOSIS — E782 Mixed hyperlipidemia: Secondary | ICD-10-CM | POA: Diagnosis not present

## 2016-02-27 DIAGNOSIS — Z0000 Encounter for general adult medical examination without abnormal findings: Secondary | ICD-10-CM | POA: Diagnosis not present

## 2016-02-28 DIAGNOSIS — R69 Illness, unspecified: Secondary | ICD-10-CM | POA: Diagnosis not present

## 2016-02-29 DIAGNOSIS — R69 Illness, unspecified: Secondary | ICD-10-CM | POA: Diagnosis not present

## 2016-03-14 DIAGNOSIS — R0902 Hypoxemia: Secondary | ICD-10-CM | POA: Diagnosis not present

## 2016-04-14 DIAGNOSIS — R0902 Hypoxemia: Secondary | ICD-10-CM | POA: Diagnosis not present

## 2016-05-02 DIAGNOSIS — J343 Hypertrophy of nasal turbinates: Secondary | ICD-10-CM | POA: Diagnosis not present

## 2016-05-02 DIAGNOSIS — E669 Obesity, unspecified: Secondary | ICD-10-CM | POA: Diagnosis not present

## 2016-05-02 DIAGNOSIS — E119 Type 2 diabetes mellitus without complications: Secondary | ICD-10-CM | POA: Diagnosis not present

## 2016-05-02 DIAGNOSIS — Z6841 Body Mass Index (BMI) 40.0 and over, adult: Secondary | ICD-10-CM | POA: Diagnosis not present

## 2016-05-02 DIAGNOSIS — R070 Pain in throat: Secondary | ICD-10-CM | POA: Diagnosis not present

## 2016-05-02 DIAGNOSIS — G4730 Sleep apnea, unspecified: Secondary | ICD-10-CM | POA: Diagnosis not present

## 2016-05-02 DIAGNOSIS — E782 Mixed hyperlipidemia: Secondary | ICD-10-CM | POA: Diagnosis not present

## 2016-05-02 DIAGNOSIS — J209 Acute bronchitis, unspecified: Secondary | ICD-10-CM | POA: Diagnosis not present

## 2016-05-02 DIAGNOSIS — I10 Essential (primary) hypertension: Secondary | ICD-10-CM | POA: Diagnosis not present

## 2016-05-15 DIAGNOSIS — R0902 Hypoxemia: Secondary | ICD-10-CM | POA: Diagnosis not present

## 2016-06-12 DIAGNOSIS — R0902 Hypoxemia: Secondary | ICD-10-CM | POA: Diagnosis not present

## 2016-06-26 DIAGNOSIS — J302 Other seasonal allergic rhinitis: Secondary | ICD-10-CM | POA: Diagnosis not present

## 2016-06-26 DIAGNOSIS — E119 Type 2 diabetes mellitus without complications: Secondary | ICD-10-CM | POA: Diagnosis not present

## 2016-06-26 DIAGNOSIS — G4730 Sleep apnea, unspecified: Secondary | ICD-10-CM | POA: Diagnosis not present

## 2016-06-26 DIAGNOSIS — I10 Essential (primary) hypertension: Secondary | ICD-10-CM | POA: Diagnosis not present

## 2016-06-26 DIAGNOSIS — E039 Hypothyroidism, unspecified: Secondary | ICD-10-CM | POA: Diagnosis not present

## 2016-06-26 DIAGNOSIS — Z6841 Body Mass Index (BMI) 40.0 and over, adult: Secondary | ICD-10-CM | POA: Diagnosis not present

## 2016-06-26 DIAGNOSIS — E782 Mixed hyperlipidemia: Secondary | ICD-10-CM | POA: Diagnosis not present

## 2016-06-26 DIAGNOSIS — J4520 Mild intermittent asthma, uncomplicated: Secondary | ICD-10-CM | POA: Diagnosis not present

## 2016-06-26 DIAGNOSIS — Z1389 Encounter for screening for other disorder: Secondary | ICD-10-CM | POA: Diagnosis not present

## 2016-07-13 DIAGNOSIS — R0902 Hypoxemia: Secondary | ICD-10-CM | POA: Diagnosis not present

## 2016-08-12 DIAGNOSIS — R0902 Hypoxemia: Secondary | ICD-10-CM | POA: Diagnosis not present

## 2016-08-22 DIAGNOSIS — R69 Illness, unspecified: Secondary | ICD-10-CM | POA: Diagnosis not present

## 2016-09-03 DIAGNOSIS — H5200 Hypermetropia, unspecified eye: Secondary | ICD-10-CM | POA: Diagnosis not present

## 2016-09-03 DIAGNOSIS — Z0100 Encounter for examination of eyes and vision without abnormal findings: Secondary | ICD-10-CM | POA: Diagnosis not present

## 2016-09-03 DIAGNOSIS — E119 Type 2 diabetes mellitus without complications: Secondary | ICD-10-CM | POA: Diagnosis not present

## 2016-09-12 DIAGNOSIS — R0902 Hypoxemia: Secondary | ICD-10-CM | POA: Diagnosis not present

## 2016-10-12 DIAGNOSIS — R0902 Hypoxemia: Secondary | ICD-10-CM | POA: Diagnosis not present

## 2016-11-12 DIAGNOSIS — R0902 Hypoxemia: Secondary | ICD-10-CM | POA: Diagnosis not present

## 2016-12-10 DIAGNOSIS — R69 Illness, unspecified: Secondary | ICD-10-CM | POA: Diagnosis not present

## 2016-12-10 DIAGNOSIS — E782 Mixed hyperlipidemia: Secondary | ICD-10-CM | POA: Diagnosis not present

## 2016-12-10 DIAGNOSIS — J4520 Mild intermittent asthma, uncomplicated: Secondary | ICD-10-CM | POA: Diagnosis not present

## 2016-12-10 DIAGNOSIS — E119 Type 2 diabetes mellitus without complications: Secondary | ICD-10-CM | POA: Diagnosis not present

## 2016-12-10 DIAGNOSIS — Z1389 Encounter for screening for other disorder: Secondary | ICD-10-CM | POA: Diagnosis not present

## 2016-12-10 DIAGNOSIS — D692 Other nonthrombocytopenic purpura: Secondary | ICD-10-CM | POA: Diagnosis not present

## 2016-12-10 DIAGNOSIS — Z6841 Body Mass Index (BMI) 40.0 and over, adult: Secondary | ICD-10-CM | POA: Diagnosis not present

## 2016-12-10 DIAGNOSIS — I10 Essential (primary) hypertension: Secondary | ICD-10-CM | POA: Diagnosis not present

## 2016-12-13 DIAGNOSIS — R0902 Hypoxemia: Secondary | ICD-10-CM | POA: Diagnosis not present

## 2017-01-07 DIAGNOSIS — Z23 Encounter for immunization: Secondary | ICD-10-CM | POA: Diagnosis not present

## 2017-01-12 DIAGNOSIS — R0902 Hypoxemia: Secondary | ICD-10-CM | POA: Diagnosis not present

## 2017-01-16 DIAGNOSIS — R69 Illness, unspecified: Secondary | ICD-10-CM | POA: Diagnosis not present

## 2017-01-28 DIAGNOSIS — Z79899 Other long term (current) drug therapy: Secondary | ICD-10-CM | POA: Diagnosis not present

## 2017-01-28 DIAGNOSIS — J309 Allergic rhinitis, unspecified: Secondary | ICD-10-CM | POA: Diagnosis not present

## 2017-01-28 DIAGNOSIS — I10 Essential (primary) hypertension: Secondary | ICD-10-CM | POA: Diagnosis not present

## 2017-01-28 DIAGNOSIS — Z6841 Body Mass Index (BMI) 40.0 and over, adult: Secondary | ICD-10-CM | POA: Diagnosis not present

## 2017-01-28 DIAGNOSIS — Z0000 Encounter for general adult medical examination without abnormal findings: Secondary | ICD-10-CM | POA: Diagnosis not present

## 2017-01-28 DIAGNOSIS — R69 Illness, unspecified: Secondary | ICD-10-CM | POA: Diagnosis not present

## 2017-01-28 DIAGNOSIS — R600 Localized edema: Secondary | ICD-10-CM | POA: Diagnosis not present

## 2017-01-28 DIAGNOSIS — J449 Chronic obstructive pulmonary disease, unspecified: Secondary | ICD-10-CM | POA: Diagnosis not present

## 2017-01-28 DIAGNOSIS — E039 Hypothyroidism, unspecified: Secondary | ICD-10-CM | POA: Diagnosis not present

## 2017-02-12 DIAGNOSIS — R0902 Hypoxemia: Secondary | ICD-10-CM | POA: Diagnosis not present

## 2017-02-15 DIAGNOSIS — R300 Dysuria: Secondary | ICD-10-CM | POA: Diagnosis not present

## 2017-02-15 DIAGNOSIS — B373 Candidiasis of vulva and vagina: Secondary | ICD-10-CM | POA: Diagnosis not present

## 2017-03-14 DIAGNOSIS — R0902 Hypoxemia: Secondary | ICD-10-CM | POA: Diagnosis not present

## 2017-04-14 DIAGNOSIS — R0902 Hypoxemia: Secondary | ICD-10-CM | POA: Diagnosis not present

## 2017-05-15 DIAGNOSIS — R0902 Hypoxemia: Secondary | ICD-10-CM | POA: Diagnosis not present

## 2017-06-12 DIAGNOSIS — R0902 Hypoxemia: Secondary | ICD-10-CM | POA: Diagnosis not present

## 2017-07-13 DIAGNOSIS — R0902 Hypoxemia: Secondary | ICD-10-CM | POA: Diagnosis not present

## 2017-07-24 DIAGNOSIS — Z6839 Body mass index (BMI) 39.0-39.9, adult: Secondary | ICD-10-CM | POA: Diagnosis not present

## 2017-07-24 DIAGNOSIS — Z1389 Encounter for screening for other disorder: Secondary | ICD-10-CM | POA: Diagnosis not present

## 2017-07-24 DIAGNOSIS — G4730 Sleep apnea, unspecified: Secondary | ICD-10-CM | POA: Diagnosis not present

## 2017-07-24 DIAGNOSIS — E119 Type 2 diabetes mellitus without complications: Secondary | ICD-10-CM | POA: Diagnosis not present

## 2017-07-24 DIAGNOSIS — E782 Mixed hyperlipidemia: Secondary | ICD-10-CM | POA: Diagnosis not present

## 2017-07-24 DIAGNOSIS — E039 Hypothyroidism, unspecified: Secondary | ICD-10-CM | POA: Diagnosis not present

## 2017-08-12 DIAGNOSIS — R0902 Hypoxemia: Secondary | ICD-10-CM | POA: Diagnosis not present

## 2017-08-14 DIAGNOSIS — J302 Other seasonal allergic rhinitis: Secondary | ICD-10-CM | POA: Diagnosis not present

## 2017-08-14 DIAGNOSIS — Z6839 Body mass index (BMI) 39.0-39.9, adult: Secondary | ICD-10-CM | POA: Diagnosis not present

## 2017-08-14 DIAGNOSIS — J4520 Mild intermittent asthma, uncomplicated: Secondary | ICD-10-CM | POA: Diagnosis not present

## 2017-08-14 DIAGNOSIS — G4730 Sleep apnea, unspecified: Secondary | ICD-10-CM | POA: Diagnosis not present

## 2017-08-14 DIAGNOSIS — Z0001 Encounter for general adult medical examination with abnormal findings: Secondary | ICD-10-CM | POA: Diagnosis not present

## 2017-08-14 DIAGNOSIS — E782 Mixed hyperlipidemia: Secondary | ICD-10-CM | POA: Diagnosis not present

## 2017-08-14 DIAGNOSIS — Z1389 Encounter for screening for other disorder: Secondary | ICD-10-CM | POA: Diagnosis not present

## 2017-08-14 DIAGNOSIS — H6122 Impacted cerumen, left ear: Secondary | ICD-10-CM | POA: Diagnosis not present

## 2017-09-12 DIAGNOSIS — R0902 Hypoxemia: Secondary | ICD-10-CM | POA: Diagnosis not present

## 2017-10-12 DIAGNOSIS — R0902 Hypoxemia: Secondary | ICD-10-CM | POA: Diagnosis not present

## 2017-11-12 DIAGNOSIS — R0902 Hypoxemia: Secondary | ICD-10-CM | POA: Diagnosis not present

## 2017-11-24 DIAGNOSIS — R32 Unspecified urinary incontinence: Secondary | ICD-10-CM | POA: Diagnosis not present

## 2017-11-24 DIAGNOSIS — J449 Chronic obstructive pulmonary disease, unspecified: Secondary | ICD-10-CM | POA: Diagnosis not present

## 2017-11-24 DIAGNOSIS — Z6835 Body mass index (BMI) 35.0-35.9, adult: Secondary | ICD-10-CM | POA: Diagnosis not present

## 2017-11-24 DIAGNOSIS — R600 Localized edema: Secondary | ICD-10-CM | POA: Diagnosis not present

## 2017-11-24 DIAGNOSIS — R69 Illness, unspecified: Secondary | ICD-10-CM | POA: Diagnosis not present

## 2017-11-24 DIAGNOSIS — E039 Hypothyroidism, unspecified: Secondary | ICD-10-CM | POA: Diagnosis not present

## 2017-11-24 DIAGNOSIS — J309 Allergic rhinitis, unspecified: Secondary | ICD-10-CM | POA: Diagnosis not present

## 2017-11-24 DIAGNOSIS — Z809 Family history of malignant neoplasm, unspecified: Secondary | ICD-10-CM | POA: Diagnosis not present

## 2017-11-24 DIAGNOSIS — I10 Essential (primary) hypertension: Secondary | ICD-10-CM | POA: Diagnosis not present

## 2017-12-03 DIAGNOSIS — M25562 Pain in left knee: Secondary | ICD-10-CM | POA: Diagnosis not present

## 2017-12-03 DIAGNOSIS — R350 Frequency of micturition: Secondary | ICD-10-CM | POA: Diagnosis not present

## 2017-12-03 DIAGNOSIS — M7989 Other specified soft tissue disorders: Secondary | ICD-10-CM | POA: Diagnosis not present

## 2017-12-07 ENCOUNTER — Emergency Department (HOSPITAL_COMMUNITY)
Admission: EM | Admit: 2017-12-07 | Discharge: 2017-12-07 | Disposition: A | Discharge status: Home / Self Care | Payer: Medicare HMO | Attending: Emergency Medicine | Admitting: Emergency Medicine

## 2017-12-07 ENCOUNTER — Encounter (HOSPITAL_COMMUNITY): Payer: Self-pay

## 2017-12-07 DIAGNOSIS — E119 Type 2 diabetes mellitus without complications: Secondary | ICD-10-CM | POA: Diagnosis not present

## 2017-12-07 DIAGNOSIS — I10 Essential (primary) hypertension: Secondary | ICD-10-CM | POA: Diagnosis not present

## 2017-12-07 DIAGNOSIS — Z79899 Other long term (current) drug therapy: Secondary | ICD-10-CM | POA: Diagnosis not present

## 2017-12-07 LAB — CBC WITH DIFFERENTIAL/PLATELET
Basophils Absolute: 0 10*3/uL (ref 0.0–0.1)
Basophils Relative: 0 %
EOS PCT: 0 %
Eosinophils Absolute: 0.1 10*3/uL (ref 0.0–0.7)
HCT: 45.3 % (ref 36.0–46.0)
HEMOGLOBIN: 15.1 g/dL — AB (ref 12.0–15.0)
Lymphocytes Relative: 28 %
Lymphs Abs: 3.9 10*3/uL (ref 0.7–4.0)
MCH: 31.7 pg (ref 26.0–34.0)
MCHC: 33.3 g/dL (ref 30.0–36.0)
MCV: 95 fL (ref 78.0–100.0)
MONOS PCT: 9 %
Monocytes Absolute: 1.2 10*3/uL — ABNORMAL HIGH (ref 0.1–1.0)
NEUTROS PCT: 63 %
Neutro Abs: 8.5 10*3/uL — ABNORMAL HIGH (ref 1.7–7.7)
Platelets: 202 10*3/uL (ref 150–400)
RBC: 4.77 MIL/uL (ref 3.87–5.11)
RDW: 13.7 % (ref 11.5–15.5)
WBC: 13.6 10*3/uL — ABNORMAL HIGH (ref 4.0–10.5)

## 2017-12-07 LAB — URINALYSIS, ROUTINE W REFLEX MICROSCOPIC
Bilirubin Urine: NEGATIVE
Glucose, UA: NEGATIVE mg/dL
Hgb urine dipstick: NEGATIVE
Ketones, ur: NEGATIVE mg/dL
Nitrite: NEGATIVE
PH: 8 (ref 5.0–8.0)
PROTEIN: NEGATIVE mg/dL
Specific Gravity, Urine: 1.008 (ref 1.005–1.030)

## 2017-12-07 LAB — BASIC METABOLIC PANEL
Anion gap: 8 (ref 5–15)
BUN: 15 mg/dL (ref 8–23)
CHLORIDE: 107 mmol/L (ref 98–111)
CO2: 26 mmol/L (ref 22–32)
CREATININE: 0.53 mg/dL (ref 0.44–1.00)
Calcium: 8.7 mg/dL — ABNORMAL LOW (ref 8.9–10.3)
GFR calc Af Amer: >60 mL/min (ref >60)
GFR calc non Af Amer: >60 mL/min (ref >60)
GLUCOSE: 78 mg/dL (ref 70–99)
Potassium: 3.6 mmol/L (ref 3.5–5.1)
SODIUM: 141 mmol/L (ref 135–145)

## 2017-12-07 LAB — TROPONIN I

## 2017-12-07 NOTE — Discharge Instructions (Addendum)
Blood work was good.  Recommend taking a blood pressure log and discussing the results with your primary care doctor for further recommendations.

## 2017-12-07 NOTE — ED Provider Notes (Signed)
[facility] EMERGENCY DEPARTMENT Provider Note   CSN: [id number] Arrival date & time: 12/07/17  1200     History   Chief Complaint Chief Complaint  Patient presents with  . Hypertension    HPI Stacy Franco is a 82 y.o. female.  Patient is concerned about her blood pressure.  She claims it was 200/90 this morning at home.  She takes her blood pressure medicine on an erratic schedule.  She is supposed to take metoprolol 50 mg twice a day, but she often takes half of the dose.  This morning she doubled her dose after her blood pressure was noted to be elevated.  Otherwise she feels well.  No chest pain, dyspnea, neurological deficits.  Past medical history includes diabetes, hypertension, both thyroidism.  Severity of symptoms is mild to moderate.  Nothing makes symptoms better or worse.     Past Medical History:  Diagnosis Date  . Cataracts, bilateral   . Diabetes mellitus   . Hypertension   . Sleep apnea   . Thyroid disease     There are no active problems to display for this patient.   Past Surgical History:  Procedure Laterality Date  . ABDOMINAL HYSTERECTOMY    . bunyun    . eye implants    . OVARIAN CYST REMOVAL    . TONSILLECTOMY       OB History    Gravida  5   Para  5   Term  5   Preterm      AB      Living  5     SAB      TAB      Ectopic      Multiple      Live Births               Home Medications    Prior to Admission medications   Medication Sig Start Date End Date Taking? Authorizing Provider  albuterol (PROVENTIL) (2.5 MG/3ML) 0.083% nebulizer solution Take 2.5 mg by nebulization every 6 (six) hours as needed for wheezing or shortness of breath.   Yes [provider]  diazepam (VALIUM) 5 MG tablet Take 5 mg by mouth every 6 (six) hours as needed for anxiety.   Yes [provider]  furosemide (LASIX) 20 MG tablet Take 20 mg by mouth daily as needed for fluid.   Yes [provider]    levothyroxine (SYNTHROID, LEVOTHROID) 88 MCG tablet Take 88 mcg by mouth daily before breakfast.   Yes [provider]  metoprolol (LOPRESSOR) 50 MG tablet Take 25 mg by mouth 2 (two) times daily.    Yes [provider]  montelukast (SINGULAIR) 10 MG tablet Take 10 mg by mouth daily.   Yes [provider]  nitrofurantoin, macrocrystal-monohydrate, (MACROBID) 100 MG capsule Take 1 capsule by mouth 2 (two) times daily. 12/03/17 12/08/17 Yes [provider]  predniSONE (DELTASONE) 20 MG tablet Take 1 tablet by mouth 2 (two) times daily. 12/03/17 12/08/17 Yes [provider]    Family History Family History  Problem Relation Age of Onset  . Cirrhosis Brother   . Lung cancer Brother   . Colon cancer Mother     Social History Social History   Tobacco Use  . Smoking status: Never Smoker  . Smokeless tobacco: Never Used  Substance Use Topics  . Alcohol use: No  . Drug use: No     Allergies   Morphine and related; Penicillins; Shellfish allergy;  Sulfa antibiotics; and Codeine   Review of Systems Review of Systems  All other systems reviewed and are negative.    Physical Exam Updated Vital Signs BP (!) 197/82   Pulse (!) 46   Temp 97.7 F (36.5 C)   Resp 12   Ht 5' (1.524 m)   Wt 84.7 kg   SpO2 97%   BMI 36.48 kg/m   Physical Exam  Constitutional: She is oriented to person, place, and time. She appears well-developed and well-nourished.  No acute distress; blood pressure elevated.  HENT:  Head: Normocephalic and atraumatic.  Eyes: Conjunctivae are normal.  Neck: Neck supple.  Cardiovascular: Normal rate and regular rhythm.  Pulmonary/Chest: Effort normal and breath sounds normal.  Abdominal: Soft. Bowel sounds are normal.  Musculoskeletal: Normal range of motion.  Neurological: She is alert and oriented to person, place, and time.  Skin: Skin is warm and dry.  Psychiatric: She has a normal mood and affect. Her behavior is  normal.  Nursing note and vitals reviewed.    ED Treatments / Results  Labs (all labs ordered are listed, but only abnormal results are displayed) Labs Reviewed  CBC WITH DIFFERENTIAL/PLATELET - Abnormal; Notable for the following components:      Result Value   WBC 13.6 (*)    Hemoglobin 15.1 (*)    Neutro Abs 8.5 (*)    Monocytes Absolute 1.2 (*)    All other components within normal limits  BASIC METABOLIC PANEL - Abnormal; Notable for the following components:   Calcium 8.7 (*)    All other components within normal limits  URINALYSIS, ROUTINE W REFLEX MICROSCOPIC - Abnormal; Notable for the following components:   Color, Urine STRAW (*)    Leukocytes, UA SMALL (*)    Bacteria, UA RARE (*)    All other components within normal limits  TROPONIN I    EKG EKG Interpretation  Date/Time:  Sunday December 07 2017 13:10:51 EDT Ventricular Rate:  44 PR Interval:    QRS Duration: 101 QT Interval:  496 QTC Calculation: 425 R Axis:   -11 Text Interpretation:  Sinus bradycardia Low voltage, precordial leads Probable anteroseptal infarct, old Nonspecific T abnormalities, inferior leads Confirmed by Nat Christen [phone]) on 12/07/2017 2:28:26 PM   Radiology No results found.  Procedures Procedures (including critical care time)  Medications Ordered in ED Medications - No data to display   Initial Impression / Assessment and Plan / ED Course  I have reviewed the triage vital signs and the nursing notes.  Pertinent labs & imaging results that were available during my care of the patient were reviewed by me and considered in my medical decision making (see chart for details).     Patient noted to have elevated blood pressure today.  She takes her medication on an erratic schedule.  Screening labs including CBC, BMET, troponin, urinalysis all normal.  EKG reveals sinus bradycardia which is expected secondary to her increased beta-blocker consumption.  Discussed all these  findings with the patient and her 2 daughters.  She will maintain a blood pressure log and follow-up with her primary care doctor for further antihypertensive recommendations.  Final Clinical Impressions(s) / ED Diagnoses   Final diagnoses:  Hypertension, unspecified type    ED Discharge Orders    None       Nat Christen, MD 12/07/17 1544

## 2017-12-07 NOTE — ED Triage Notes (Signed)
Pt to ER for hypertension 200/90 at home. Pt reports taking 100 mg of metoprolol this morning pta. Pt felt slightly lightheaded this morning. Denies currently.

## 2017-12-12 DIAGNOSIS — G8929 Other chronic pain: Secondary | ICD-10-CM | POA: Diagnosis not present

## 2017-12-12 DIAGNOSIS — M25562 Pain in left knee: Secondary | ICD-10-CM | POA: Diagnosis not present

## 2017-12-12 DIAGNOSIS — M1712 Unilateral primary osteoarthritis, left knee: Secondary | ICD-10-CM | POA: Diagnosis not present

## 2017-12-13 DIAGNOSIS — R0902 Hypoxemia: Secondary | ICD-10-CM | POA: Diagnosis not present

## 2017-12-17 DIAGNOSIS — R69 Illness, unspecified: Secondary | ICD-10-CM | POA: Diagnosis not present

## 2017-12-17 DIAGNOSIS — J310 Chronic rhinitis: Secondary | ICD-10-CM | POA: Diagnosis not present

## 2017-12-17 DIAGNOSIS — Z6836 Body mass index (BMI) 36.0-36.9, adult: Secondary | ICD-10-CM | POA: Diagnosis not present

## 2017-12-17 DIAGNOSIS — J302 Other seasonal allergic rhinitis: Secondary | ICD-10-CM | POA: Diagnosis not present

## 2017-12-24 ENCOUNTER — Ambulatory Visit: Payer: Self-pay | Admitting: Orthopedic Surgery

## 2017-12-26 DIAGNOSIS — R69 Illness, unspecified: Secondary | ICD-10-CM | POA: Diagnosis not present

## 2018-01-12 DIAGNOSIS — R0902 Hypoxemia: Secondary | ICD-10-CM | POA: Diagnosis not present

## 2018-01-24 DIAGNOSIS — R69 Illness, unspecified: Secondary | ICD-10-CM | POA: Diagnosis not present

## 2018-01-25 DIAGNOSIS — G8929 Other chronic pain: Secondary | ICD-10-CM | POA: Diagnosis not present

## 2018-01-25 DIAGNOSIS — M25562 Pain in left knee: Secondary | ICD-10-CM | POA: Diagnosis not present

## 2018-01-25 DIAGNOSIS — J019 Acute sinusitis, unspecified: Secondary | ICD-10-CM | POA: Diagnosis not present

## 2018-01-25 DIAGNOSIS — B9689 Other specified bacterial agents as the cause of diseases classified elsewhere: Secondary | ICD-10-CM | POA: Diagnosis not present

## 2018-01-25 DIAGNOSIS — H8309 Labyrinthitis, unspecified ear: Secondary | ICD-10-CM | POA: Diagnosis not present

## 2018-01-25 DIAGNOSIS — H66003 Acute suppurative otitis media without spontaneous rupture of ear drum, bilateral: Secondary | ICD-10-CM | POA: Diagnosis not present

## 2018-02-06 DIAGNOSIS — M1712 Unilateral primary osteoarthritis, left knee: Secondary | ICD-10-CM | POA: Diagnosis not present

## 2018-02-06 DIAGNOSIS — G8929 Other chronic pain: Secondary | ICD-10-CM | POA: Diagnosis not present

## 2018-02-06 DIAGNOSIS — M25562 Pain in left knee: Secondary | ICD-10-CM | POA: Diagnosis not present

## 2018-02-12 DIAGNOSIS — R0902 Hypoxemia: Secondary | ICD-10-CM | POA: Diagnosis not present

## 2018-02-20 DIAGNOSIS — Z6838 Body mass index (BMI) 38.0-38.9, adult: Secondary | ICD-10-CM | POA: Diagnosis not present

## 2018-02-20 DIAGNOSIS — Z23 Encounter for immunization: Secondary | ICD-10-CM | POA: Diagnosis not present

## 2018-02-20 DIAGNOSIS — Z1389 Encounter for screening for other disorder: Secondary | ICD-10-CM | POA: Diagnosis not present

## 2018-02-20 DIAGNOSIS — E538 Deficiency of other specified B group vitamins: Secondary | ICD-10-CM | POA: Diagnosis not present

## 2018-02-20 DIAGNOSIS — E559 Vitamin D deficiency, unspecified: Secondary | ICD-10-CM | POA: Diagnosis not present

## 2018-02-20 DIAGNOSIS — G4730 Sleep apnea, unspecified: Secondary | ICD-10-CM | POA: Diagnosis not present

## 2018-02-20 DIAGNOSIS — J4520 Mild intermittent asthma, uncomplicated: Secondary | ICD-10-CM | POA: Diagnosis not present

## 2018-02-20 DIAGNOSIS — E119 Type 2 diabetes mellitus without complications: Secondary | ICD-10-CM | POA: Diagnosis not present

## 2018-02-20 DIAGNOSIS — E782 Mixed hyperlipidemia: Secondary | ICD-10-CM | POA: Diagnosis not present

## 2018-02-20 DIAGNOSIS — E039 Hypothyroidism, unspecified: Secondary | ICD-10-CM | POA: Diagnosis not present

## 2018-02-20 DIAGNOSIS — I10 Essential (primary) hypertension: Secondary | ICD-10-CM | POA: Diagnosis not present

## 2018-02-23 DIAGNOSIS — R69 Illness, unspecified: Secondary | ICD-10-CM | POA: Diagnosis not present

## 2018-03-14 DIAGNOSIS — R0902 Hypoxemia: Secondary | ICD-10-CM | POA: Diagnosis not present

## 2018-03-26 DIAGNOSIS — R69 Illness, unspecified: Secondary | ICD-10-CM | POA: Diagnosis not present

## 2018-04-14 DIAGNOSIS — R0902 Hypoxemia: Secondary | ICD-10-CM | POA: Diagnosis not present

## 2018-04-17 DIAGNOSIS — H43813 Vitreous degeneration, bilateral: Secondary | ICD-10-CM | POA: Diagnosis not present

## 2018-04-17 DIAGNOSIS — H524 Presbyopia: Secondary | ICD-10-CM | POA: Diagnosis not present

## 2018-04-17 DIAGNOSIS — H5203 Hypermetropia, bilateral: Secondary | ICD-10-CM | POA: Diagnosis not present

## 2018-04-17 DIAGNOSIS — Z961 Presence of intraocular lens: Secondary | ICD-10-CM | POA: Diagnosis not present

## 2018-04-17 DIAGNOSIS — H52223 Regular astigmatism, bilateral: Secondary | ICD-10-CM | POA: Diagnosis not present

## 2018-04-17 DIAGNOSIS — E119 Type 2 diabetes mellitus without complications: Secondary | ICD-10-CM | POA: Diagnosis not present

## 2018-04-17 DIAGNOSIS — Z9849 Cataract extraction status, unspecified eye: Secondary | ICD-10-CM | POA: Diagnosis not present

## 2018-05-15 DIAGNOSIS — R0902 Hypoxemia: Secondary | ICD-10-CM | POA: Diagnosis not present

## 2018-06-13 DIAGNOSIS — R0902 Hypoxemia: Secondary | ICD-10-CM | POA: Diagnosis not present

## 2018-07-14 DIAGNOSIS — R0902 Hypoxemia: Secondary | ICD-10-CM | POA: Diagnosis not present

## 2018-07-28 DIAGNOSIS — R609 Edema, unspecified: Secondary | ICD-10-CM | POA: Diagnosis not present

## 2018-07-28 DIAGNOSIS — Z8249 Family history of ischemic heart disease and other diseases of the circulatory system: Secondary | ICD-10-CM | POA: Diagnosis not present

## 2018-07-28 DIAGNOSIS — R32 Unspecified urinary incontinence: Secondary | ICD-10-CM | POA: Diagnosis not present

## 2018-07-28 DIAGNOSIS — E119 Type 2 diabetes mellitus without complications: Secondary | ICD-10-CM | POA: Diagnosis not present

## 2018-07-28 DIAGNOSIS — R69 Illness, unspecified: Secondary | ICD-10-CM | POA: Diagnosis not present

## 2018-07-28 DIAGNOSIS — E039 Hypothyroidism, unspecified: Secondary | ICD-10-CM | POA: Diagnosis not present

## 2018-07-28 DIAGNOSIS — J449 Chronic obstructive pulmonary disease, unspecified: Secondary | ICD-10-CM | POA: Diagnosis not present

## 2018-07-28 DIAGNOSIS — I10 Essential (primary) hypertension: Secondary | ICD-10-CM | POA: Diagnosis not present

## 2018-07-28 DIAGNOSIS — M1990 Unspecified osteoarthritis, unspecified site: Secondary | ICD-10-CM | POA: Diagnosis not present

## 2018-07-28 DIAGNOSIS — Z809 Family history of malignant neoplasm, unspecified: Secondary | ICD-10-CM | POA: Diagnosis not present

## 2018-08-13 DIAGNOSIS — R0902 Hypoxemia: Secondary | ICD-10-CM | POA: Diagnosis not present

## 2018-09-04 DIAGNOSIS — E119 Type 2 diabetes mellitus without complications: Secondary | ICD-10-CM | POA: Diagnosis not present

## 2018-09-04 DIAGNOSIS — Z6839 Body mass index (BMI) 39.0-39.9, adult: Secondary | ICD-10-CM | POA: Diagnosis not present

## 2018-09-04 DIAGNOSIS — I10 Essential (primary) hypertension: Secondary | ICD-10-CM | POA: Diagnosis not present

## 2018-09-04 DIAGNOSIS — D692 Other nonthrombocytopenic purpura: Secondary | ICD-10-CM | POA: Diagnosis not present

## 2018-09-04 DIAGNOSIS — J302 Other seasonal allergic rhinitis: Secondary | ICD-10-CM | POA: Diagnosis not present

## 2018-09-04 DIAGNOSIS — R69 Illness, unspecified: Secondary | ICD-10-CM | POA: Diagnosis not present

## 2018-09-04 DIAGNOSIS — G4730 Sleep apnea, unspecified: Secondary | ICD-10-CM | POA: Diagnosis not present

## 2018-09-04 DIAGNOSIS — E782 Mixed hyperlipidemia: Secondary | ICD-10-CM | POA: Diagnosis not present

## 2018-09-04 DIAGNOSIS — J4520 Mild intermittent asthma, uncomplicated: Secondary | ICD-10-CM | POA: Diagnosis not present

## 2018-09-04 DIAGNOSIS — Z0001 Encounter for general adult medical examination with abnormal findings: Secondary | ICD-10-CM | POA: Diagnosis not present

## 2018-09-04 DIAGNOSIS — E039 Hypothyroidism, unspecified: Secondary | ICD-10-CM | POA: Diagnosis not present

## 2018-09-04 DIAGNOSIS — Z1389 Encounter for screening for other disorder: Secondary | ICD-10-CM | POA: Diagnosis not present

## 2018-09-13 DIAGNOSIS — R0902 Hypoxemia: Secondary | ICD-10-CM | POA: Diagnosis not present

## 2018-10-13 DIAGNOSIS — R0902 Hypoxemia: Secondary | ICD-10-CM | POA: Diagnosis not present

## 2018-11-13 DIAGNOSIS — R0902 Hypoxemia: Secondary | ICD-10-CM | POA: Diagnosis not present

## 2018-11-27 DIAGNOSIS — M79605 Pain in left leg: Secondary | ICD-10-CM | POA: Diagnosis not present

## 2018-11-27 DIAGNOSIS — M5136 Other intervertebral disc degeneration, lumbar region: Secondary | ICD-10-CM | POA: Diagnosis not present

## 2018-11-27 DIAGNOSIS — M25562 Pain in left knee: Secondary | ICD-10-CM | POA: Diagnosis not present

## 2018-11-27 DIAGNOSIS — M1712 Unilateral primary osteoarthritis, left knee: Secondary | ICD-10-CM | POA: Diagnosis not present

## 2018-11-27 DIAGNOSIS — M47816 Spondylosis without myelopathy or radiculopathy, lumbar region: Secondary | ICD-10-CM | POA: Diagnosis not present

## 2018-11-27 DIAGNOSIS — G8929 Other chronic pain: Secondary | ICD-10-CM | POA: Diagnosis not present

## 2018-12-14 DIAGNOSIS — R0902 Hypoxemia: Secondary | ICD-10-CM | POA: Diagnosis not present

## 2019-01-08 DIAGNOSIS — R69 Illness, unspecified: Secondary | ICD-10-CM | POA: Diagnosis not present

## 2019-01-13 DIAGNOSIS — R0902 Hypoxemia: Secondary | ICD-10-CM | POA: Diagnosis not present

## 2019-01-15 DIAGNOSIS — Z23 Encounter for immunization: Secondary | ICD-10-CM | POA: Diagnosis not present

## 2019-02-13 DIAGNOSIS — R0902 Hypoxemia: Secondary | ICD-10-CM | POA: Diagnosis not present

## 2019-03-15 DIAGNOSIS — R0902 Hypoxemia: Secondary | ICD-10-CM | POA: Diagnosis not present

## 2019-04-15 DIAGNOSIS — R0902 Hypoxemia: Secondary | ICD-10-CM | POA: Diagnosis not present

## 2019-04-16 DIAGNOSIS — E7849 Other hyperlipidemia: Secondary | ICD-10-CM | POA: Diagnosis not present

## 2019-04-16 DIAGNOSIS — R69 Illness, unspecified: Secondary | ICD-10-CM | POA: Diagnosis not present

## 2019-04-16 DIAGNOSIS — R7309 Other abnormal glucose: Secondary | ICD-10-CM | POA: Diagnosis not present

## 2019-04-16 DIAGNOSIS — I10 Essential (primary) hypertension: Secondary | ICD-10-CM | POA: Diagnosis not present

## 2019-04-16 DIAGNOSIS — G4730 Sleep apnea, unspecified: Secondary | ICD-10-CM | POA: Diagnosis not present

## 2019-04-16 DIAGNOSIS — Z6839 Body mass index (BMI) 39.0-39.9, adult: Secondary | ICD-10-CM | POA: Diagnosis not present

## 2019-04-16 DIAGNOSIS — E039 Hypothyroidism, unspecified: Secondary | ICD-10-CM | POA: Diagnosis not present

## 2019-04-16 DIAGNOSIS — Z1389 Encounter for screening for other disorder: Secondary | ICD-10-CM | POA: Diagnosis not present

## 2019-04-16 DIAGNOSIS — J4520 Mild intermittent asthma, uncomplicated: Secondary | ICD-10-CM | POA: Diagnosis not present

## 2019-05-16 DIAGNOSIS — R0902 Hypoxemia: Secondary | ICD-10-CM | POA: Diagnosis not present

## 2019-06-10 DIAGNOSIS — B351 Tinea unguium: Secondary | ICD-10-CM | POA: Diagnosis not present

## 2019-06-10 DIAGNOSIS — M79674 Pain in right toe(s): Secondary | ICD-10-CM | POA: Diagnosis not present

## 2019-06-10 DIAGNOSIS — M79675 Pain in left toe(s): Secondary | ICD-10-CM | POA: Diagnosis not present

## 2019-06-13 DIAGNOSIS — R0902 Hypoxemia: Secondary | ICD-10-CM | POA: Diagnosis not present

## 2019-06-30 DIAGNOSIS — J4520 Mild intermittent asthma, uncomplicated: Secondary | ICD-10-CM | POA: Diagnosis not present

## 2019-06-30 DIAGNOSIS — E039 Hypothyroidism, unspecified: Secondary | ICD-10-CM | POA: Diagnosis not present

## 2019-06-30 DIAGNOSIS — E119 Type 2 diabetes mellitus without complications: Secondary | ICD-10-CM | POA: Diagnosis not present

## 2019-06-30 DIAGNOSIS — I10 Essential (primary) hypertension: Secondary | ICD-10-CM | POA: Diagnosis not present

## 2019-07-02 DIAGNOSIS — R69 Illness, unspecified: Secondary | ICD-10-CM | POA: Diagnosis not present

## 2019-07-09 DIAGNOSIS — W57XXXA Bitten or stung by nonvenomous insect and other nonvenomous arthropods, initial encounter: Secondary | ICD-10-CM | POA: Diagnosis not present

## 2019-07-09 DIAGNOSIS — S30860A Insect bite (nonvenomous) of lower back and pelvis, initial encounter: Secondary | ICD-10-CM | POA: Diagnosis not present

## 2019-07-12 DIAGNOSIS — E039 Hypothyroidism, unspecified: Secondary | ICD-10-CM | POA: Diagnosis not present

## 2019-07-12 DIAGNOSIS — W57XXXD Bitten or stung by nonvenomous insect and other nonvenomous arthropods, subsequent encounter: Secondary | ICD-10-CM | POA: Diagnosis not present

## 2019-07-12 DIAGNOSIS — Z6839 Body mass index (BMI) 39.0-39.9, adult: Secondary | ICD-10-CM | POA: Diagnosis not present

## 2019-07-14 DIAGNOSIS — R0902 Hypoxemia: Secondary | ICD-10-CM | POA: Diagnosis not present

## 2019-07-22 DIAGNOSIS — Z7689 Persons encountering health services in other specified circumstances: Secondary | ICD-10-CM | POA: Diagnosis not present

## 2019-07-22 DIAGNOSIS — R39191 Need to immediately re-void: Secondary | ICD-10-CM | POA: Diagnosis not present

## 2019-07-22 DIAGNOSIS — E119 Type 2 diabetes mellitus without complications: Secondary | ICD-10-CM | POA: Diagnosis not present

## 2019-07-22 DIAGNOSIS — M159 Polyosteoarthritis, unspecified: Secondary | ICD-10-CM | POA: Diagnosis not present

## 2019-07-22 DIAGNOSIS — Z6839 Body mass index (BMI) 39.0-39.9, adult: Secondary | ICD-10-CM | POA: Diagnosis not present

## 2019-07-23 DIAGNOSIS — H5200 Hypermetropia, unspecified eye: Secondary | ICD-10-CM | POA: Diagnosis not present

## 2019-07-30 DIAGNOSIS — E119 Type 2 diabetes mellitus without complications: Secondary | ICD-10-CM | POA: Diagnosis not present

## 2019-07-30 DIAGNOSIS — I10 Essential (primary) hypertension: Secondary | ICD-10-CM | POA: Diagnosis not present

## 2019-07-30 DIAGNOSIS — J449 Chronic obstructive pulmonary disease, unspecified: Secondary | ICD-10-CM | POA: Diagnosis not present

## 2019-07-30 DIAGNOSIS — J4520 Mild intermittent asthma, uncomplicated: Secondary | ICD-10-CM | POA: Diagnosis not present

## 2019-08-06 DIAGNOSIS — L570 Actinic keratosis: Secondary | ICD-10-CM | POA: Diagnosis not present

## 2019-08-06 DIAGNOSIS — D225 Melanocytic nevi of trunk: Secondary | ICD-10-CM | POA: Diagnosis not present

## 2019-08-06 DIAGNOSIS — X32XXXA Exposure to sunlight, initial encounter: Secondary | ICD-10-CM | POA: Diagnosis not present

## 2019-08-13 DIAGNOSIS — R0902 Hypoxemia: Secondary | ICD-10-CM | POA: Diagnosis not present

## 2019-08-30 DIAGNOSIS — J449 Chronic obstructive pulmonary disease, unspecified: Secondary | ICD-10-CM | POA: Diagnosis not present

## 2019-08-30 DIAGNOSIS — I10 Essential (primary) hypertension: Secondary | ICD-10-CM | POA: Diagnosis not present

## 2019-08-30 DIAGNOSIS — E119 Type 2 diabetes mellitus without complications: Secondary | ICD-10-CM | POA: Diagnosis not present

## 2019-08-30 DIAGNOSIS — J4520 Mild intermittent asthma, uncomplicated: Secondary | ICD-10-CM | POA: Diagnosis not present

## 2019-09-13 DIAGNOSIS — R0902 Hypoxemia: Secondary | ICD-10-CM | POA: Diagnosis not present

## 2019-09-29 DIAGNOSIS — J449 Chronic obstructive pulmonary disease, unspecified: Secondary | ICD-10-CM | POA: Diagnosis not present

## 2019-09-29 DIAGNOSIS — J4520 Mild intermittent asthma, uncomplicated: Secondary | ICD-10-CM | POA: Diagnosis not present

## 2019-09-29 DIAGNOSIS — I10 Essential (primary) hypertension: Secondary | ICD-10-CM | POA: Diagnosis not present

## 2019-09-29 DIAGNOSIS — E119 Type 2 diabetes mellitus without complications: Secondary | ICD-10-CM | POA: Diagnosis not present

## 2019-10-11 DIAGNOSIS — J449 Chronic obstructive pulmonary disease, unspecified: Secondary | ICD-10-CM | POA: Diagnosis not present

## 2019-10-11 DIAGNOSIS — I10 Essential (primary) hypertension: Secondary | ICD-10-CM | POA: Diagnosis not present

## 2019-10-11 DIAGNOSIS — M1990 Unspecified osteoarthritis, unspecified site: Secondary | ICD-10-CM | POA: Diagnosis not present

## 2019-10-11 DIAGNOSIS — R0902 Hypoxemia: Secondary | ICD-10-CM | POA: Diagnosis not present

## 2019-10-11 DIAGNOSIS — R69 Illness, unspecified: Secondary | ICD-10-CM | POA: Diagnosis not present

## 2019-10-11 DIAGNOSIS — J302 Other seasonal allergic rhinitis: Secondary | ICD-10-CM | POA: Diagnosis not present

## 2019-10-11 DIAGNOSIS — R32 Unspecified urinary incontinence: Secondary | ICD-10-CM | POA: Diagnosis not present

## 2019-10-11 DIAGNOSIS — Z008 Encounter for other general examination: Secondary | ICD-10-CM | POA: Diagnosis not present

## 2019-10-11 DIAGNOSIS — E039 Hypothyroidism, unspecified: Secondary | ICD-10-CM | POA: Diagnosis not present

## 2019-10-11 DIAGNOSIS — G8929 Other chronic pain: Secondary | ICD-10-CM | POA: Diagnosis not present

## 2019-10-13 DIAGNOSIS — R0902 Hypoxemia: Secondary | ICD-10-CM | POA: Diagnosis not present

## 2019-10-29 DIAGNOSIS — E119 Type 2 diabetes mellitus without complications: Secondary | ICD-10-CM | POA: Diagnosis not present

## 2019-10-29 DIAGNOSIS — I10 Essential (primary) hypertension: Secondary | ICD-10-CM | POA: Diagnosis not present

## 2019-10-29 DIAGNOSIS — J4520 Mild intermittent asthma, uncomplicated: Secondary | ICD-10-CM | POA: Diagnosis not present

## 2019-10-29 DIAGNOSIS — J449 Chronic obstructive pulmonary disease, unspecified: Secondary | ICD-10-CM | POA: Diagnosis not present

## 2019-11-13 DIAGNOSIS — R0902 Hypoxemia: Secondary | ICD-10-CM | POA: Diagnosis not present

## 2019-11-30 DIAGNOSIS — I10 Essential (primary) hypertension: Secondary | ICD-10-CM | POA: Diagnosis not present

## 2019-11-30 DIAGNOSIS — E119 Type 2 diabetes mellitus without complications: Secondary | ICD-10-CM | POA: Diagnosis not present

## 2019-11-30 DIAGNOSIS — J449 Chronic obstructive pulmonary disease, unspecified: Secondary | ICD-10-CM | POA: Diagnosis not present

## 2019-11-30 DIAGNOSIS — J4520 Mild intermittent asthma, uncomplicated: Secondary | ICD-10-CM | POA: Diagnosis not present

## 2019-12-01 DIAGNOSIS — R69 Illness, unspecified: Secondary | ICD-10-CM | POA: Diagnosis not present

## 2019-12-10 DIAGNOSIS — J309 Allergic rhinitis, unspecified: Secondary | ICD-10-CM | POA: Diagnosis not present

## 2019-12-10 DIAGNOSIS — E118 Type 2 diabetes mellitus with unspecified complications: Secondary | ICD-10-CM | POA: Diagnosis not present

## 2019-12-10 DIAGNOSIS — E7849 Other hyperlipidemia: Secondary | ICD-10-CM | POA: Diagnosis not present

## 2019-12-10 DIAGNOSIS — Z6838 Body mass index (BMI) 38.0-38.9, adult: Secondary | ICD-10-CM | POA: Diagnosis not present

## 2019-12-10 DIAGNOSIS — Z0000 Encounter for general adult medical examination without abnormal findings: Secondary | ICD-10-CM | POA: Diagnosis not present

## 2019-12-10 DIAGNOSIS — E039 Hypothyroidism, unspecified: Secondary | ICD-10-CM | POA: Diagnosis not present

## 2019-12-10 DIAGNOSIS — E559 Vitamin D deficiency, unspecified: Secondary | ICD-10-CM | POA: Diagnosis not present

## 2019-12-10 DIAGNOSIS — Z1389 Encounter for screening for other disorder: Secondary | ICD-10-CM | POA: Diagnosis not present

## 2019-12-10 DIAGNOSIS — J449 Chronic obstructive pulmonary disease, unspecified: Secondary | ICD-10-CM | POA: Diagnosis not present

## 2019-12-14 DIAGNOSIS — R0902 Hypoxemia: Secondary | ICD-10-CM | POA: Diagnosis not present

## 2019-12-30 DIAGNOSIS — J4520 Mild intermittent asthma, uncomplicated: Secondary | ICD-10-CM | POA: Diagnosis not present

## 2019-12-30 DIAGNOSIS — E119 Type 2 diabetes mellitus without complications: Secondary | ICD-10-CM | POA: Diagnosis not present

## 2019-12-30 DIAGNOSIS — J449 Chronic obstructive pulmonary disease, unspecified: Secondary | ICD-10-CM | POA: Diagnosis not present

## 2019-12-30 DIAGNOSIS — E039 Hypothyroidism, unspecified: Secondary | ICD-10-CM | POA: Diagnosis not present

## 2020-01-13 DIAGNOSIS — R0902 Hypoxemia: Secondary | ICD-10-CM | POA: Diagnosis not present

## 2020-01-24 ENCOUNTER — Other Ambulatory Visit: Payer: Self-pay

## 2020-01-24 ENCOUNTER — Ambulatory Visit (INDEPENDENT_AMBULATORY_CARE_PROVIDER_SITE_OTHER): Payer: Medicare HMO

## 2020-01-24 ENCOUNTER — Ambulatory Visit
Admission: EM | Admit: 2020-01-24 | Discharge: 2020-01-24 | Disposition: A | Discharge status: Home / Self Care | Payer: Medicare HMO

## 2020-01-24 DIAGNOSIS — R0602 Shortness of breath: Secondary | ICD-10-CM | POA: Diagnosis not present

## 2020-01-24 DIAGNOSIS — J441 Chronic obstructive pulmonary disease with (acute) exacerbation: Secondary | ICD-10-CM | POA: Diagnosis not present

## 2020-01-24 LAB — POCT URINALYSIS DIP (MANUAL ENTRY)
Bilirubin, UA: NEGATIVE
Blood, UA: NEGATIVE
Glucose, UA: NEGATIVE mg/dL
Ketones, POC UA: NEGATIVE mg/dL
Leukocytes, UA: NEGATIVE
Nitrite, UA: NEGATIVE
Protein Ur, POC: NEGATIVE mg/dL
Spec Grav, UA: <=1.005 — AB (ref 1.010–1.025)
Urobilinogen, UA: 0.2 E.U./dL
pH, UA: 6 (ref 5.0–8.0)

## 2020-01-24 MED ORDER — MUCINEX MAXIMUM STRENGTH 1200 MG PO TB12: 1.0000 | ORAL_TABLET | Freq: Two times a day (BID) | ORAL | 1 refills | Status: DC | PRN

## 2020-01-24 MED ORDER — PREDNISONE 20 MG PO TABS: 40.0000 mg | ORAL_TABLET | Freq: Every day | ORAL | 0 refills | Status: DC

## 2020-01-24 NOTE — ED Provider Notes (Signed)
[facility]    CSN: [id number] Arrival date & time: 01/24/20  [place]      History   Chief Complaint Chief Complaint  Patient presents with  . Cough    HPI Stacy Franco is a 84 y.o. female.   HPI  Patient with a history of COPD, who is oxygen dependent as needed, presents with a few days of worsening shortness of breath and increased use of home oxygen.  She reports increased use of nebulizer treatments at home. She is prescribed home oxygen which she uses nightly and during the day as needed. She endorses mid thoracic back pain on the right side which she thinks is mucus build-up.  She desires to rule out any acute lung infection. She is afebrile and has not had any known sick contacts. Past Medical History:  Diagnosis Date  . Cataracts, bilateral   . Diabetes mellitus   . Hypertension   . Sleep apnea   . Thyroid disease     There are no problems to display for this patient.   Past Surgical History:  Procedure Laterality Date  . ABDOMINAL HYSTERECTOMY    . bunyun    . eye implants    . OVARIAN CYST REMOVAL    . TONSILLECTOMY      OB History    Gravida  5   Para  5   Term  5   Preterm      AB      Living  5     SAB      TAB      Ectopic      Multiple      Live Births               Home Medications    Prior to Admission medications   Medication Sig Start Date End Date Taking? Authorizing Provider  albuterol (PROVENTIL) (2.5 MG/3ML) 0.083% nebulizer solution Take 2.5 mg by nebulization every 6 (six) hours as needed for wheezing or shortness of breath.    [provider]  diazepam (VALIUM) 5 MG tablet Take 5 mg by mouth every 6 (six) hours as needed for anxiety.    [provider]  EUTHYROX 100 MCG tablet Take 100 mcg by mouth daily. 01/06/20   [provider]  furosemide (LASIX) 20 MG tablet Take 20 mg by mouth daily as needed for fluid.    [provider]  Guaifenesin (MUCINEX MAXIMUM  STRENGTH) 1200 MG TB12 Take 1 tablet (1,200 mg total) by mouth every 12 (twelve) hours as needed. 01/24/20   Scot Jun, FNP  levothyroxine (SYNTHROID, LEVOTHROID) 88 MCG tablet Take 88 mcg by mouth daily before breakfast.    [provider]  metoprolol (LOPRESSOR) 50 MG tablet Take 25 mg by mouth 2 (two) times daily.     [provider]  montelukast (SINGULAIR) 10 MG tablet Take 10 mg by mouth daily.    [provider]  predniSONE (DELTASONE) 20 MG tablet Take 2 tablets (40 mg total) by mouth daily with breakfast. 01/24/20   Scot Jun, FNP    Family History Family History  Problem Relation Age of Onset  . Cirrhosis Brother   . Lung cancer Brother   . Colon cancer Mother     Social History Social History   Tobacco Use  . Smoking status: Never Smoker  . Smokeless tobacco: Never Used  Substance Use Topics  . Alcohol use: No  . Drug use: No  Allergies   Morphine and related, Penicillins, Shellfish allergy, Sulfa antibiotics, and Codeine Review of Systems Review of Systems Pertinent negatives listed in HPI Physical Exam Triage Vital Signs ED Triage Vitals  Enc Vitals Group     BP 01/24/20 1715 (!) 161/84     Pulse Rate 01/24/20 1715 97     Resp 01/24/20 1715 (!) 22     Temp 01/24/20 1715 98 F (36.7 C)     Temp src --      SpO2 01/24/20 1715 93 %     Weight --      Height --      Head Circumference --      Peak Flow --      Pain Score 01/24/20 1710 0     Pain Loc --      Pain Edu? --      Excl. in [place]? --    No data found.  Updated Vital Signs BP (!) 161/84   Pulse 97   Temp 98 F (36.7 C)   Resp (!) 22   SpO2 93%   Visual Acuity Right Eye Distance:   Left Eye Distance:   Bilateral Distance:    Right Eye Near:   Left Eye Near:    Bilateral Near:     Physical Exam Constitutional:      Appearance: She is obese.     Comments: Chronically ill-appearing.  Cardiovascular:     Rate and Rhythm: Normal rate  and regular rhythm.  Pulmonary:     Breath sounds: Decreased breath sounds present.     Comments: Expiratory wheezes present. Abdominal:     General: Bowel sounds are normal. There is no distension.  Skin:    Capillary Refill: Capillary refill takes less than 2 seconds.  Neurological:     Mental Status: She is oriented to person, place, and time. Mental status is at baseline.  Psychiatric:        Mood and Affect: Mood normal.    UC Treatments / Results  Labs (all labs ordered are listed, but only abnormal results are displayed) Labs Reviewed  POCT URINALYSIS DIP (MANUAL ENTRY) - Abnormal; Notable for the following components:      Result Value   Spec Grav, UA <=1.005 (*)    All other components within normal limits    EKG   Radiology DG Chest 2 View  Result Date: 01/24/2020 CLINICAL DATA:  Shortness of breath. EXAM: CHEST - 2 VIEW COMPARISON:  10/26/2014 FINDINGS: The cardiac silhouette, mediastinal and hilar contours are within normal limits. Mild tortuosity of the thoracic aorta. Chronic bronchitic type interstitial lung changes but no acute overlying pulmonary process. No pleural effusions. The bony thorax is intact. IMPRESSION: Chronic lung changes but no acute pulmonary findings. Electronically Signed   By: Marijo Sanes M.D.   On: 01/24/2020 18:18    Procedures Procedures (including critical care time)  Medications Ordered in UC Medications - No data to display  Initial Impression / Assessment and Plan / UC Course  I have reviewed the triage vital signs and the nursing notes.  Pertinent labs & imaging results that were available during my care of the patient were reviewed by me and considered in my medical decision making (see chart for details).     Chest x-ray is negative for pneumonia. UA negative. Treating for acute COPD exacerbation with prednisone and Mucinex. Recommend use of albuterol nebulizer every 6 hours as needed and oxygen with increased work of  breathing.  Follow-up with PCP as needed. Final Clinical Impressions(s) / UC Diagnoses   Final diagnoses:  Acute exacerbation of chronic obstructive pulmonary disease (COPD) ([place])     Discharge Instructions     Urinalysis was normal and showed no UTI.  Treating you for an acute COPD exacerbation with prednisone 40 mg once daily for 5 days.  Also prescribed guaifenesin 1200 mg twice daily.  Take guaifenesin with a 8 ounce glass of water this should help with you expelling any extra secretions.  If any of your symptoms worsen or do not improve follow-up with your primary care provider or return for reevaluation.   ED Prescriptions    Medication Sig Dispense Auth. Provider   predniSONE (DELTASONE) 20 MG tablet Take 2 tablets (40 mg total) by mouth daily with breakfast. 10 tablet Scot Jun, FNP   Guaifenesin (MUCINEX MAXIMUM STRENGTH) 1200 MG TB12 Take 1 tablet (1,200 mg total) by mouth every 12 (twelve) hours as needed. 14 tablet Scot Jun, FNP     PDMP not reviewed this encounter.   Scot Jun, FNP 01/25/20 [phone]

## 2020-01-24 NOTE — Discharge Instructions (Signed)
Urinalysis was normal and showed no UTI.  Treating you for an acute COPD exacerbation with prednisone 40 mg once daily for 5 days.  Also prescribed guaifenesin 1200 mg twice daily.  Take guaifenesin with a 8 ounce glass of water this should help with you expelling any extra secretions.  If any of your symptoms worsen or do not improve follow-up with your primary care provider or return for reevaluation.

## 2020-01-24 NOTE — ED Triage Notes (Signed)
Pt states she has chronic cough from COPD but for past several days she is using nebulizer more often and and isn't helping as well

## 2020-01-25 ENCOUNTER — Telehealth (HOSPITAL_COMMUNITY): Payer: Self-pay | Admitting: Emergency Medicine

## 2020-01-25 MED ORDER — MUCINEX MAXIMUM STRENGTH 1200 MG PO TB12: 1.0000 | ORAL_TABLET | Freq: Two times a day (BID) | ORAL | 1 refills | Status: DC | PRN

## 2020-01-25 NOTE — Telephone Encounter (Signed)
Patient's daughter called and states they were unable to fill her cough medicine prescription because [facility] was refusing to fill it since it is also over the counter.  Will try [facility], and then follow up if continued issues.

## 2020-01-29 DIAGNOSIS — J449 Chronic obstructive pulmonary disease, unspecified: Secondary | ICD-10-CM | POA: Diagnosis not present

## 2020-01-29 DIAGNOSIS — E039 Hypothyroidism, unspecified: Secondary | ICD-10-CM | POA: Diagnosis not present

## 2020-01-29 DIAGNOSIS — E119 Type 2 diabetes mellitus without complications: Secondary | ICD-10-CM | POA: Diagnosis not present

## 2020-01-29 DIAGNOSIS — J4520 Mild intermittent asthma, uncomplicated: Secondary | ICD-10-CM | POA: Diagnosis not present

## 2020-02-04 DIAGNOSIS — Z23 Encounter for immunization: Secondary | ICD-10-CM | POA: Diagnosis not present

## 2020-02-04 DIAGNOSIS — J441 Chronic obstructive pulmonary disease with (acute) exacerbation: Secondary | ICD-10-CM | POA: Diagnosis not present

## 2020-02-04 DIAGNOSIS — Z6838 Body mass index (BMI) 38.0-38.9, adult: Secondary | ICD-10-CM | POA: Diagnosis not present

## 2020-02-13 DIAGNOSIS — R0902 Hypoxemia: Secondary | ICD-10-CM | POA: Diagnosis not present

## 2020-02-29 DIAGNOSIS — E119 Type 2 diabetes mellitus without complications: Secondary | ICD-10-CM | POA: Diagnosis not present

## 2020-02-29 DIAGNOSIS — J449 Chronic obstructive pulmonary disease, unspecified: Secondary | ICD-10-CM | POA: Diagnosis not present

## 2020-02-29 DIAGNOSIS — E039 Hypothyroidism, unspecified: Secondary | ICD-10-CM | POA: Diagnosis not present

## 2020-02-29 DIAGNOSIS — J4520 Mild intermittent asthma, uncomplicated: Secondary | ICD-10-CM | POA: Diagnosis not present

## 2020-03-14 DIAGNOSIS — R0902 Hypoxemia: Secondary | ICD-10-CM | POA: Diagnosis not present

## 2020-03-17 ENCOUNTER — Other Ambulatory Visit: Payer: Self-pay

## 2020-03-17 ENCOUNTER — Ambulatory Visit
Admission: EM | Admit: 2020-03-17 | Discharge: 2020-03-17 | Disposition: A | Discharge status: Home / Self Care | Payer: Medicare HMO | Attending: Emergency Medicine | Admitting: Emergency Medicine

## 2020-03-17 DIAGNOSIS — Z1152 Encounter for screening for COVID-19: Secondary | ICD-10-CM | POA: Diagnosis not present

## 2020-03-17 DIAGNOSIS — M2550 Pain in unspecified joint: Secondary | ICD-10-CM

## 2020-03-17 DIAGNOSIS — R252 Cramp and spasm: Secondary | ICD-10-CM

## 2020-03-17 MED ORDER — MELOXICAM 5 MG PO CAPS: 5.0000 mg | ORAL_CAPSULE | Freq: Every day | ORAL | 0 refills | Status: AC

## 2020-03-17 NOTE — ED Triage Notes (Signed)
Pt wants covid test

## 2020-03-17 NOTE — ED Provider Notes (Signed)
[place]   [id number] 03/17/20 Arrival Time: 4782   Chief Complaint  Patient presents with  . Joint Pain     SUBJECTIVE: History from: patient.  Stacy Franco is a 84 y.o. female with history of arthritis presents to the urgent care with a complaint of joint pain and will no true leg cramping for the past few weeks.  Denies precipitating event.  Localized the pain to her joint and cramping to left leg.  She describes the pain as constant and achy.  She has tried OTC medications without relief.  Her symptoms are made worse with ROM.  She denies similar symptoms in the past.  Denies chills, fever, nausea, vomiting, diarrhea  She also like to have a COVID-19 test completed as she will be taking care of her daughter after surgery.  ROS: As per HPI.  All other pertinent ROS negative.      Past Medical History:  Diagnosis Date  . Cataracts, bilateral   . Diabetes mellitus   . Hypertension   . Sleep apnea   . Thyroid disease    Past Surgical History:  Procedure Laterality Date  . ABDOMINAL HYSTERECTOMY    . bunyun    . eye implants    . OVARIAN CYST REMOVAL    . TONSILLECTOMY     Allergies  Allergen Reactions  . Morphine And Related Rash and Other (See Comments)    "Almost killed me"  . Penicillins Shortness Of Breath and Rash  . Shellfish Allergy Shortness Of Breath and Swelling  . Sulfa Antibiotics Shortness Of Breath and Rash  . Codeine Hives   No current facility-administered medications on file prior to encounter.   Current Outpatient Medications on File Prior to Encounter  Medication Sig Dispense Refill  . albuterol (PROVENTIL) (2.5 MG/3ML) 0.083% nebulizer solution Take 2.5 mg by nebulization every 6 (six) hours as needed for wheezing or shortness of breath.    . diazepam (VALIUM) 5 MG tablet Take 5 mg by mouth every 6 (six) hours as needed for anxiety.    . EUTHYROX 100 MCG tablet Take 100 mcg by mouth daily.    . furosemide (LASIX) 20 MG  tablet Take 20 mg by mouth daily as needed for fluid.    . Guaifenesin (MUCINEX MAXIMUM STRENGTH) 1200 MG TB12 Take 1 tablet (1,200 mg total) by mouth every 12 (twelve) hours as needed. 14 tablet 1  . levothyroxine (SYNTHROID, LEVOTHROID) 88 MCG tablet Take 88 mcg by mouth daily before breakfast.    . metoprolol (LOPRESSOR) 50 MG tablet Take 25 mg by mouth 2 (two) times daily.     . montelukast (SINGULAIR) 10 MG tablet Take 10 mg by mouth daily.    . predniSONE (DELTASONE) 20 MG tablet Take 2 tablets (40 mg total) by mouth daily with breakfast. 10 tablet 0   Social History   Socioeconomic History  . Marital status: Married    Spouse name: Not on file  . Number of children: Not on file  . Years of education: Not on file  . Highest education level: Not on file  Occupational History  . Not on file  Tobacco Use  . Smoking status: Never Smoker  . Smokeless tobacco: Never Used  Substance and Sexual Activity  . Alcohol use: No  . Drug use: No  . Sexual activity: Not on file  Other Topics Concern  . Not on file  Social History Narrative  . Not on file   Social Determinants of  Health   Financial Resource Strain: Not on file  Food Insecurity: Not on file  Transportation Needs: Not on file  Physical Activity: Not on file  Stress: Not on file  Social Connections: Not on file  Intimate Partner Violence: Not on file   Family History  Problem Relation Age of Onset  . Cirrhosis Brother   . Lung cancer Brother   . Colon cancer Mother     OBJECTIVE:  Vitals:   03/17/20 1147  BP: (!) 169/97  Pulse: 61  Resp: 18  Temp: 98.5 F (36.9 C)  SpO2: 94%     Physical Exam Vitals and nursing note reviewed.  Constitutional:      General: She is not in acute distress.    Appearance: Normal appearance. She is normal weight. She is not ill-appearing, toxic-appearing or diaphoretic.  HENT:     Head: Normocephalic.  Cardiovascular:     Rate and Rhythm: Normal rate and regular rhythm.      Pulses: Normal pulses.     Heart sounds: Normal heart sounds. No murmur heard. No friction rub. No gallop.   Pulmonary:     Effort: Pulmonary effort is normal. No respiratory distress.     Breath sounds: Normal breath sounds. No stridor. No wheezing, rhonchi or rales.  Chest:     Chest wall: No tenderness.  Musculoskeletal:        General: Tenderness present.     Comments: Leg cramping present at night, right leg tender to touch, joint pain present  Neurological:     Mental Status: She is alert and oriented to person, place, and time.      LABS:  No results found for this or any previous visit (from the past 24 hour(s)).   ASSESSMENT & PLAN:  1. Leg cramping   2. Encounter for screening for COVID-19   3. Arthralgia, unspecified joint     Meds ordered this encounter  Medications  . Meloxicam 5 MG CAPS    Sig: Take 5 mg by mouth daily. Do not mix with other NSAIDs    Dispense:  30 capsule    Refill:  0    Discharge instructions  Meloxicam was prescribed for arthritis pain Continue to use Voltaren gel as prescribed and directed Use OTC vitamin B complex for nocturnal leg cramping Follow-up with PCP Return or go to ED if you develop any new or worsening of his symptoms  Reviewed expectations re: course of current medical issues. Questions answered. Outlined signs and symptoms indicating need for more acute intervention. Patient verbalized understanding. After Visit Summary given.         Emerson Monte, FNP 03/17/20 1308

## 2020-03-17 NOTE — Discharge Instructions (Addendum)
Meloxicam was prescribed for arthritis pain Continue to use Voltaren gel as prescribed and directed Use OTC vitamin B complex for nocturnal leg cramping Follow-up with PCP Return or go to ED if you develop any new or worsening of his symptoms

## 2020-03-17 NOTE — ED Triage Notes (Signed)
Pt presents with joint pain that she believes is arthritis pain and also having muscle cramps in legs , is currently taking prednisone

## 2020-03-18 LAB — NOVEL CORONAVIRUS, NAA: SARS-CoV-2, NAA: NOT DETECTED

## 2020-03-18 LAB — SARS-COV-2, NAA 2 DAY TAT

## 2020-03-31 DIAGNOSIS — J449 Chronic obstructive pulmonary disease, unspecified: Secondary | ICD-10-CM | POA: Diagnosis not present

## 2020-03-31 DIAGNOSIS — E119 Type 2 diabetes mellitus without complications: Secondary | ICD-10-CM | POA: Diagnosis not present

## 2020-03-31 DIAGNOSIS — J4520 Mild intermittent asthma, uncomplicated: Secondary | ICD-10-CM | POA: Diagnosis not present

## 2020-03-31 DIAGNOSIS — E039 Hypothyroidism, unspecified: Secondary | ICD-10-CM | POA: Diagnosis not present

## 2020-04-14 DIAGNOSIS — R0902 Hypoxemia: Secondary | ICD-10-CM | POA: Diagnosis not present

## 2020-04-29 DIAGNOSIS — E039 Hypothyroidism, unspecified: Secondary | ICD-10-CM | POA: Diagnosis not present

## 2020-04-29 DIAGNOSIS — E119 Type 2 diabetes mellitus without complications: Secondary | ICD-10-CM | POA: Diagnosis not present

## 2020-04-29 DIAGNOSIS — J4520 Mild intermittent asthma, uncomplicated: Secondary | ICD-10-CM | POA: Diagnosis not present

## 2020-04-29 DIAGNOSIS — I10 Essential (primary) hypertension: Secondary | ICD-10-CM | POA: Diagnosis not present

## 2020-05-15 DIAGNOSIS — R0902 Hypoxemia: Secondary | ICD-10-CM | POA: Diagnosis not present

## 2020-05-29 DIAGNOSIS — E039 Hypothyroidism, unspecified: Secondary | ICD-10-CM | POA: Diagnosis not present

## 2020-05-29 DIAGNOSIS — E119 Type 2 diabetes mellitus without complications: Secondary | ICD-10-CM | POA: Diagnosis not present

## 2020-05-29 DIAGNOSIS — I10 Essential (primary) hypertension: Secondary | ICD-10-CM | POA: Diagnosis not present

## 2020-05-29 DIAGNOSIS — J449 Chronic obstructive pulmonary disease, unspecified: Secondary | ICD-10-CM | POA: Diagnosis not present

## 2020-05-29 DIAGNOSIS — J4520 Mild intermittent asthma, uncomplicated: Secondary | ICD-10-CM | POA: Diagnosis not present

## 2020-06-12 DIAGNOSIS — R0902 Hypoxemia: Secondary | ICD-10-CM | POA: Diagnosis not present

## 2020-06-28 DIAGNOSIS — E119 Type 2 diabetes mellitus without complications: Secondary | ICD-10-CM | POA: Diagnosis not present

## 2020-06-28 DIAGNOSIS — J449 Chronic obstructive pulmonary disease, unspecified: Secondary | ICD-10-CM | POA: Diagnosis not present

## 2020-06-28 DIAGNOSIS — I10 Essential (primary) hypertension: Secondary | ICD-10-CM | POA: Diagnosis not present

## 2020-06-28 DIAGNOSIS — E039 Hypothyroidism, unspecified: Secondary | ICD-10-CM | POA: Diagnosis not present

## 2020-07-01 ENCOUNTER — Ambulatory Visit
Admission: EM | Admit: 2020-07-01 | Discharge: 2020-07-01 | Disposition: A | Discharge status: Home / Self Care | Payer: Medicare HMO | Attending: Emergency Medicine | Admitting: Emergency Medicine

## 2020-07-01 ENCOUNTER — Other Ambulatory Visit: Payer: Self-pay

## 2020-07-01 ENCOUNTER — Encounter: Payer: Self-pay | Admitting: Emergency Medicine

## 2020-07-01 DIAGNOSIS — M545 Low back pain, unspecified: Secondary | ICD-10-CM

## 2020-07-01 DIAGNOSIS — R350 Frequency of micturition: Secondary | ICD-10-CM | POA: Diagnosis not present

## 2020-07-01 DIAGNOSIS — R252 Cramp and spasm: Secondary | ICD-10-CM | POA: Diagnosis not present

## 2020-07-01 LAB — POCT URINALYSIS DIP (MANUAL ENTRY)
Bilirubin, UA: NEGATIVE
Blood, UA: NEGATIVE
Glucose, UA: NEGATIVE mg/dL
Ketones, POC UA: NEGATIVE mg/dL
Nitrite, UA: NEGATIVE
Protein Ur, POC: NEGATIVE mg/dL
Spec Grav, UA: <=1.005 — AB (ref 1.010–1.025)
Urobilinogen, UA: 0.2 E.U./dL
pH, UA: 7 (ref 5.0–8.0)

## 2020-07-01 MED ORDER — PREDNISONE 20 MG PO TABS: 20.0000 mg | ORAL_TABLET | Freq: Two times a day (BID) | ORAL | 0 refills | Status: AC

## 2020-07-01 MED ORDER — TIZANIDINE HCL 2 MG PO CAPS: 2.0000 mg | ORAL_CAPSULE | Freq: Every evening | ORAL | 0 refills | Status: AC | PRN

## 2020-07-01 NOTE — Discharge Instructions (Signed)
Urine without obvious signs of infection today, but did show dehydration Push fluids and follow up with PCP for recheck urine and leg cramps  Continue conservative management of rest, ice, and gentle stretches/ massage Prednisone prescribed.  Take as directed and to completion Take zanaflex at nighttime for symptomatic relief. Avoid driving or operating heavy machinery while using medication. Follow up with PCP if symptoms persist Return or go to the ER if you have any new or worsening symptoms (fever, chills, chest pain, abdominal pain, changes in bowel or bladder habits, pain radiating into lower legs, etc...)

## 2020-07-01 NOTE — ED Triage Notes (Signed)
Lower Back pain x 1 week, leg cramps.

## 2020-07-01 NOTE — ED Provider Notes (Signed)
[facility]   [id number] 07/01/20 Arrival Time: 6283  CC: Back pain  SUBJECTIVE: History from: patient. Stacy Franco is a 85 y.o. female complains of bilateral low back pain x 1 week.  Symptoms began after performing yard work.  Localizes the pain to the low back.  Describes the pain as intermittent and achy in character.  Has tried OTC medications without relief.  Symptoms are made worse with activity.  Also reports increased urinary frequency, and leg cramps.  Speculates leg cramps are from not drinking enough water.  Denies fever, chills, erythema, ecchymosis, effusion, weakness, numbness and tingling, saddle paresthesias, loss of bowel or bladder function.      ROS: As per HPI.  All other pertinent ROS negative.     Past Medical History:  Diagnosis Date  . Cataracts, bilateral   . Diabetes mellitus   . Hypertension   . Sleep apnea   . Thyroid disease    Past Surgical History:  Procedure Laterality Date  . ABDOMINAL HYSTERECTOMY    . bunyun    . eye implants    . OVARIAN CYST REMOVAL    . TONSILLECTOMY     Allergies  Allergen Reactions  . Morphine And Related Rash and Other (See Comments)    "Almost killed me"  . Penicillins Shortness Of Breath and Rash  . Shellfish Allergy Shortness Of Breath and Swelling  . Sulfa Antibiotics Shortness Of Breath and Rash  . Codeine Hives   No current facility-administered medications on file prior to encounter.   Current Outpatient Medications on File Prior to Encounter  Medication Sig Dispense Refill  . albuterol (PROVENTIL) (2.5 MG/3ML) 0.083% nebulizer solution Take 2.5 mg by nebulization every 6 (six) hours as needed for wheezing or shortness of breath.    . diazepam (VALIUM) 5 MG tablet Take 5 mg by mouth every 6 (six) hours as needed for anxiety.    . EUTHYROX 100 MCG tablet Take 100 mcg by mouth daily.    . furosemide (LASIX) 20 MG tablet Take 20 mg by mouth daily as needed for fluid.    . Guaifenesin (MUCINEX  MAXIMUM STRENGTH) 1200 MG TB12 Take 1 tablet (1,200 mg total) by mouth every 12 (twelve) hours as needed. 14 tablet 1  . levothyroxine (SYNTHROID, LEVOTHROID) 88 MCG tablet Take 88 mcg by mouth daily before breakfast.    . Meloxicam 5 MG CAPS Take 5 mg by mouth daily. Do not mix with other NSAIDs 30 capsule 0  . metoprolol (LOPRESSOR) 50 MG tablet Take 25 mg by mouth 2 (two) times daily.     . montelukast (SINGULAIR) 10 MG tablet Take 10 mg by mouth daily.     Social History   Socioeconomic History  . Marital status: Married    Spouse name: Not on file  . Number of children: Not on file  . Years of education: Not on file  . Highest education level: Not on file  Occupational History  . Not on file  Tobacco Use  . Smoking status: Never Smoker  . Smokeless tobacco: Never Used  Substance and Sexual Activity  . Alcohol use: No  . Drug use: No  . Sexual activity: Not on file  Other Topics Concern  . Not on file  Social History Narrative  . Not on file   Social Determinants of Health   Financial Resource Strain: Not on file  Food Insecurity: Not on file  Transportation Needs: Not on file  Physical Activity: Not on  file  Stress: Not on file  Social Connections: Not on file  Intimate Partner Violence: Not on file   Family History  Problem Relation Age of Onset  . Cirrhosis Brother   . Lung cancer Brother   . Colon cancer Mother     OBJECTIVE:  Vitals:   07/01/20 1246  BP: (!) 176/68  Pulse: 72  Resp: 18  Temp: 98 F (36.7 C)  TempSrc: Oral  SpO2: 90%    General appearance: ALERT; in no acute distress.  Head: NCAT Lungs: Normal respiratory effort; CTAB CV: RRR Musculoskeletal: Back  Inspection: Skin warm, dry, clear and intact without obvious erythema, effusion, or ecchymosis.  Palpation: Nontender to palpation ROM: FROM active and passive Strength:5/5 hip flexion, 5/5 hip extension Abdomen: soft, nondistended, normal active bowel sounds; nontender to  palpation; no guarding  Skin: warm and dry Neurologic: Ambulates without difficulty; Sensation intact about the upper/ lower extremities Psychological: alert and cooperative; normal mood and affect  LABS:  Results for orders placed or performed during the hospital encounter of 07/01/20 (from the past 24 hour(s))  POCT urinalysis dipstick     Status: Abnormal   Collection Time: 07/01/20  1:24 PM  Result Value Ref Range   Color, UA yellow yellow   Clarity, UA clear clear   Glucose, UA negative negative mg/dL   Bilirubin, UA negative negative   Ketones, POC UA negative negative mg/dL   Spec Grav, UA <=1.005 (A) 1.010 - 1.025   Blood, UA negative negative   pH, UA 7.0 5.0 - 8.0   Protein Ur, POC negative negative mg/dL   Urobilinogen, UA 0.2 0.2 or 1.0 E.U./dL   Nitrite, UA Negative Negative   Leukocytes, UA Trace (A) Negative     ASSESSMENT & PLAN:  1. Acute bilateral low back pain without sciatica   2. Urinary frequency   3. Leg cramps     Meds ordered this encounter  Medications  . predniSONE (DELTASONE) 20 MG tablet    Sig: Take 1 tablet (20 mg total) by mouth 2 (two) times daily with a meal for 5 days.    Dispense:  10 tablet    Refill:  0    Order Specific Question:   Supervising Provider    Answer:   Raylene Everts [[id number]]  . tizanidine (ZANAFLEX) 2 MG capsule    Sig: Take 1 capsule (2 mg total) by mouth at bedtime as needed for muscle spasms.    Dispense:  20 capsule    Refill:  0    Order Specific Question:   Supervising Provider    Answer:   Raylene Everts [[id number]]   Urine without obvious signs of infection today, but did show dehydration Push fluids and follow up with PCP for recheck urine and leg cramps  Continue conservative management of rest, ice, and gentle stretches/ massage Prednisone prescribed.  Take as directed and to completion Take zanaflex at nighttime for symptomatic relief. Avoid driving or operating heavy machinery while using  medication. Follow up with PCP if symptoms persist Return or go to the ER if you have any new or worsening symptoms (fever, chills, chest pain, abdominal pain, changes in bowel or bladder habits, pain radiating into lower legs, etc...)   Reviewed expectations re: course of current medical issues. Questions answered. Outlined signs and symptoms indicating need for more acute intervention. Patient verbalized understanding. After Visit Summary given.    Lestine Box, PA-C 07/01/20 1335

## 2020-07-02 LAB — URINE CULTURE: Culture: NO GROWTH

## 2020-07-04 DIAGNOSIS — D649 Anemia, unspecified: Secondary | ICD-10-CM | POA: Diagnosis not present

## 2020-07-04 DIAGNOSIS — E559 Vitamin D deficiency, unspecified: Secondary | ICD-10-CM | POA: Diagnosis not present

## 2020-07-04 DIAGNOSIS — E039 Hypothyroidism, unspecified: Secondary | ICD-10-CM | POA: Diagnosis not present

## 2020-07-04 DIAGNOSIS — R5383 Other fatigue: Secondary | ICD-10-CM | POA: Diagnosis not present

## 2020-07-04 DIAGNOSIS — E538 Deficiency of other specified B group vitamins: Secondary | ICD-10-CM | POA: Diagnosis not present

## 2020-07-13 DIAGNOSIS — R0902 Hypoxemia: Secondary | ICD-10-CM | POA: Diagnosis not present

## 2020-07-29 DIAGNOSIS — E119 Type 2 diabetes mellitus without complications: Secondary | ICD-10-CM | POA: Diagnosis not present

## 2020-07-29 DIAGNOSIS — E039 Hypothyroidism, unspecified: Secondary | ICD-10-CM | POA: Diagnosis not present

## 2020-07-29 DIAGNOSIS — J449 Chronic obstructive pulmonary disease, unspecified: Secondary | ICD-10-CM | POA: Diagnosis not present

## 2020-07-29 DIAGNOSIS — I10 Essential (primary) hypertension: Secondary | ICD-10-CM | POA: Diagnosis not present

## 2020-08-12 DIAGNOSIS — R0902 Hypoxemia: Secondary | ICD-10-CM | POA: Diagnosis not present

## 2020-08-28 DIAGNOSIS — M1990 Unspecified osteoarthritis, unspecified site: Secondary | ICD-10-CM | POA: Diagnosis not present

## 2020-08-28 DIAGNOSIS — I499 Cardiac arrhythmia, unspecified: Secondary | ICD-10-CM | POA: Diagnosis not present

## 2020-08-28 DIAGNOSIS — R69 Illness, unspecified: Secondary | ICD-10-CM | POA: Diagnosis not present

## 2020-08-28 DIAGNOSIS — E039 Hypothyroidism, unspecified: Secondary | ICD-10-CM | POA: Diagnosis not present

## 2020-08-28 DIAGNOSIS — J301 Allergic rhinitis due to pollen: Secondary | ICD-10-CM | POA: Diagnosis not present

## 2020-08-28 DIAGNOSIS — J449 Chronic obstructive pulmonary disease, unspecified: Secondary | ICD-10-CM | POA: Diagnosis not present

## 2020-08-28 DIAGNOSIS — G8929 Other chronic pain: Secondary | ICD-10-CM | POA: Diagnosis not present

## 2020-08-28 DIAGNOSIS — I10 Essential (primary) hypertension: Secondary | ICD-10-CM | POA: Diagnosis not present

## 2020-08-28 DIAGNOSIS — R32 Unspecified urinary incontinence: Secondary | ICD-10-CM | POA: Diagnosis not present

## 2020-08-29 DIAGNOSIS — J449 Chronic obstructive pulmonary disease, unspecified: Secondary | ICD-10-CM | POA: Diagnosis not present

## 2020-08-29 DIAGNOSIS — E039 Hypothyroidism, unspecified: Secondary | ICD-10-CM | POA: Diagnosis not present

## 2020-08-29 DIAGNOSIS — E119 Type 2 diabetes mellitus without complications: Secondary | ICD-10-CM | POA: Diagnosis not present

## 2020-08-29 DIAGNOSIS — I10 Essential (primary) hypertension: Secondary | ICD-10-CM | POA: Diagnosis not present

## 2020-09-12 DIAGNOSIS — R0902 Hypoxemia: Secondary | ICD-10-CM | POA: Diagnosis not present

## 2020-09-28 DIAGNOSIS — J449 Chronic obstructive pulmonary disease, unspecified: Secondary | ICD-10-CM | POA: Diagnosis not present

## 2020-09-28 DIAGNOSIS — E039 Hypothyroidism, unspecified: Secondary | ICD-10-CM | POA: Diagnosis not present

## 2020-09-28 DIAGNOSIS — I10 Essential (primary) hypertension: Secondary | ICD-10-CM | POA: Diagnosis not present

## 2020-09-28 DIAGNOSIS — E119 Type 2 diabetes mellitus without complications: Secondary | ICD-10-CM | POA: Diagnosis not present

## 2020-10-12 DIAGNOSIS — R0902 Hypoxemia: Secondary | ICD-10-CM | POA: Diagnosis not present

## 2020-11-12 DIAGNOSIS — R0902 Hypoxemia: Secondary | ICD-10-CM | POA: Diagnosis not present

## 2020-11-15 ENCOUNTER — Other Ambulatory Visit (HOSPITAL_COMMUNITY): Payer: Self-pay | Admitting: Family Medicine

## 2020-11-15 ENCOUNTER — Ambulatory Visit (HOSPITAL_COMMUNITY)
Admission: RE | Admit: 2020-11-15 | Discharge: 2020-11-15 | Disposition: A | Discharge status: Home / Self Care | Payer: Medicare HMO | Source: Ambulatory Visit | Attending: Family Medicine | Admitting: Family Medicine

## 2020-11-15 ENCOUNTER — Other Ambulatory Visit: Payer: Self-pay

## 2020-11-15 DIAGNOSIS — J309 Allergic rhinitis, unspecified: Secondary | ICD-10-CM | POA: Diagnosis not present

## 2020-11-15 DIAGNOSIS — I10 Essential (primary) hypertension: Secondary | ICD-10-CM

## 2020-11-15 DIAGNOSIS — J449 Chronic obstructive pulmonary disease, unspecified: Secondary | ICD-10-CM | POA: Diagnosis not present

## 2020-11-15 DIAGNOSIS — E559 Vitamin D deficiency, unspecified: Secondary | ICD-10-CM | POA: Diagnosis not present

## 2020-11-15 DIAGNOSIS — Z6838 Body mass index (BMI) 38.0-38.9, adult: Secondary | ICD-10-CM | POA: Diagnosis not present

## 2020-11-15 DIAGNOSIS — M546 Pain in thoracic spine: Secondary | ICD-10-CM | POA: Diagnosis not present

## 2020-11-15 DIAGNOSIS — J4520 Mild intermittent asthma, uncomplicated: Secondary | ICD-10-CM | POA: Diagnosis not present

## 2020-11-15 DIAGNOSIS — Z9981 Dependence on supplemental oxygen: Secondary | ICD-10-CM | POA: Diagnosis not present

## 2020-11-15 DIAGNOSIS — R059 Cough, unspecified: Secondary | ICD-10-CM | POA: Diagnosis not present

## 2020-11-15 DIAGNOSIS — G4730 Sleep apnea, unspecified: Secondary | ICD-10-CM | POA: Diagnosis not present

## 2020-11-15 DIAGNOSIS — E782 Mixed hyperlipidemia: Secondary | ICD-10-CM | POA: Diagnosis not present

## 2020-11-15 DIAGNOSIS — E118 Type 2 diabetes mellitus with unspecified complications: Secondary | ICD-10-CM | POA: Diagnosis not present

## 2020-11-29 DIAGNOSIS — E039 Hypothyroidism, unspecified: Secondary | ICD-10-CM | POA: Diagnosis not present

## 2020-11-29 DIAGNOSIS — I10 Essential (primary) hypertension: Secondary | ICD-10-CM | POA: Diagnosis not present

## 2020-11-29 DIAGNOSIS — J449 Chronic obstructive pulmonary disease, unspecified: Secondary | ICD-10-CM | POA: Diagnosis not present

## 2020-11-29 DIAGNOSIS — E119 Type 2 diabetes mellitus without complications: Secondary | ICD-10-CM | POA: Diagnosis not present

## 2020-12-13 DIAGNOSIS — R0902 Hypoxemia: Secondary | ICD-10-CM | POA: Diagnosis not present

## 2020-12-15 DIAGNOSIS — E782 Mixed hyperlipidemia: Secondary | ICD-10-CM | POA: Diagnosis not present

## 2020-12-15 DIAGNOSIS — Z6839 Body mass index (BMI) 39.0-39.9, adult: Secondary | ICD-10-CM | POA: Diagnosis not present

## 2020-12-15 DIAGNOSIS — Z0001 Encounter for general adult medical examination with abnormal findings: Secondary | ICD-10-CM | POA: Diagnosis not present

## 2020-12-15 DIAGNOSIS — J4520 Mild intermittent asthma, uncomplicated: Secondary | ICD-10-CM | POA: Diagnosis not present

## 2020-12-15 DIAGNOSIS — G4730 Sleep apnea, unspecified: Secondary | ICD-10-CM | POA: Diagnosis not present

## 2020-12-15 DIAGNOSIS — J449 Chronic obstructive pulmonary disease, unspecified: Secondary | ICD-10-CM | POA: Diagnosis not present

## 2020-12-15 DIAGNOSIS — E118 Type 2 diabetes mellitus with unspecified complications: Secondary | ICD-10-CM | POA: Diagnosis not present

## 2020-12-15 DIAGNOSIS — Z1331 Encounter for screening for depression: Secondary | ICD-10-CM | POA: Diagnosis not present

## 2021-01-09 DIAGNOSIS — Z0100 Encounter for examination of eyes and vision without abnormal findings: Secondary | ICD-10-CM | POA: Diagnosis not present

## 2021-01-09 DIAGNOSIS — H5200 Hypermetropia, unspecified eye: Secondary | ICD-10-CM | POA: Diagnosis not present

## 2021-01-12 DIAGNOSIS — R0902 Hypoxemia: Secondary | ICD-10-CM | POA: Diagnosis not present

## 2021-01-20 IMAGING — DX DG CHEST 2V
2 series · 2 of 2 positions shown · non-contrast
Comparison: 10/26/2014

CLINICAL DATA: Shortness of breath.

EXAM:
CHEST - 2 VIEW

[chest pa]
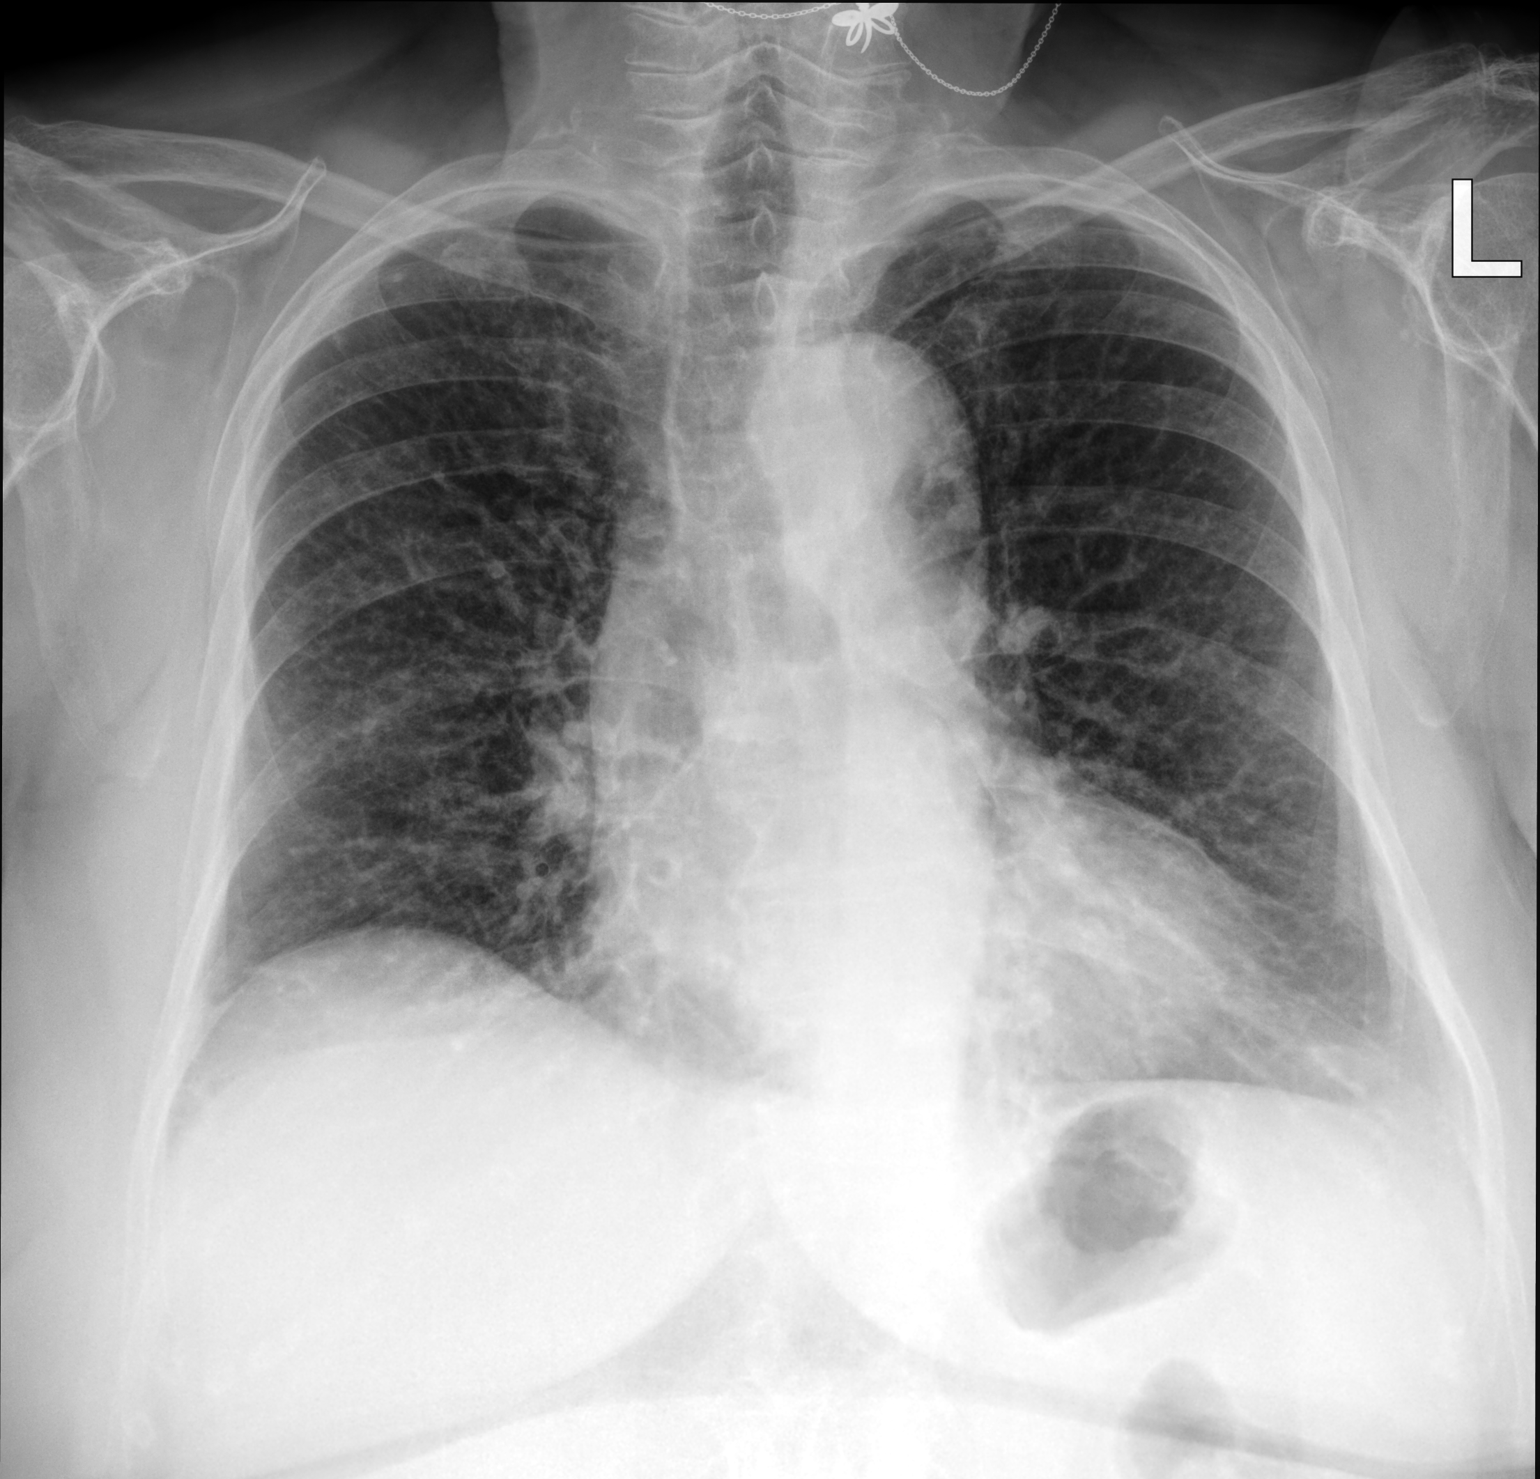

[chest lat]
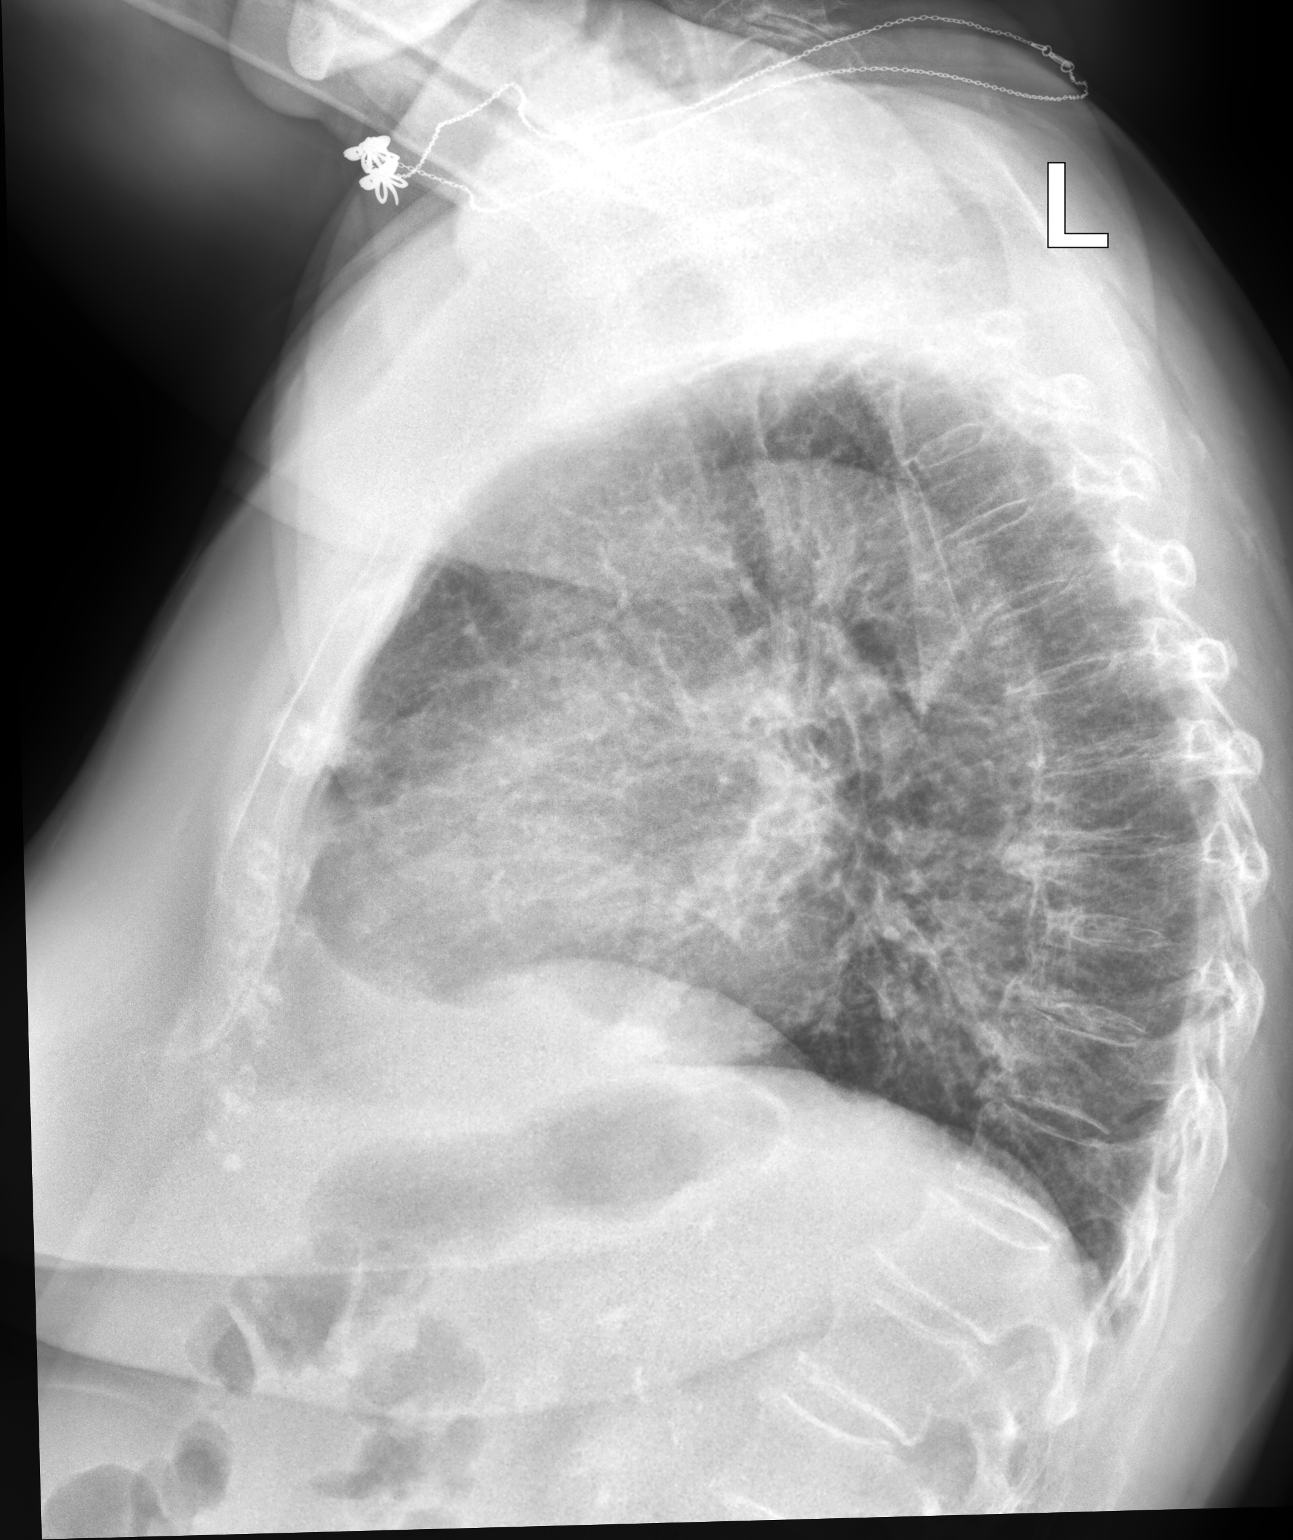

[2 of 2 positions shown; findings below may reference images not displayed]

FINDINGS: The cardiac silhouette, mediastinal and hilar contours are within
normal limits. Mild tortuosity of the thoracic aorta. Chronic
bronchitic type interstitial lung changes but no acute overlying
pulmonary process. No pleural effusions. The bony thorax is intact.
IMPRESSION: Chronic lung changes but no acute pulmonary findings.

## 2021-02-02 DIAGNOSIS — Z23 Encounter for immunization: Secondary | ICD-10-CM | POA: Diagnosis not present

## 2021-02-12 DIAGNOSIS — R0902 Hypoxemia: Secondary | ICD-10-CM | POA: Diagnosis not present

## 2021-03-14 DIAGNOSIS — R0902 Hypoxemia: Secondary | ICD-10-CM | POA: Diagnosis not present

## 2021-04-14 DIAGNOSIS — R0902 Hypoxemia: Secondary | ICD-10-CM | POA: Diagnosis not present

## 2021-04-16 DIAGNOSIS — J329 Chronic sinusitis, unspecified: Secondary | ICD-10-CM | POA: Diagnosis not present

## 2021-04-22 ENCOUNTER — Ambulatory Visit (INDEPENDENT_AMBULATORY_CARE_PROVIDER_SITE_OTHER): Payer: Medicare HMO

## 2021-04-22 ENCOUNTER — Ambulatory Visit
Admission: EM | Admit: 2021-04-22 | Discharge: 2021-04-22 | Disposition: A | Discharge status: Home / Self Care | Payer: Medicare HMO

## 2021-04-22 ENCOUNTER — Other Ambulatory Visit: Payer: Self-pay

## 2021-04-22 DIAGNOSIS — J189 Pneumonia, unspecified organism: Secondary | ICD-10-CM | POA: Diagnosis not present

## 2021-04-22 DIAGNOSIS — R059 Cough, unspecified: Secondary | ICD-10-CM

## 2021-04-22 DIAGNOSIS — R053 Chronic cough: Secondary | ICD-10-CM

## 2021-04-22 DIAGNOSIS — R0602 Shortness of breath: Secondary | ICD-10-CM | POA: Diagnosis not present

## 2021-04-22 DIAGNOSIS — J449 Chronic obstructive pulmonary disease, unspecified: Secondary | ICD-10-CM | POA: Diagnosis not present

## 2021-04-22 DIAGNOSIS — I517 Cardiomegaly: Secondary | ICD-10-CM | POA: Diagnosis not present

## 2021-04-22 MED ORDER — PROMETHAZINE-DM 6.25-15 MG/5ML PO SYRP: 5.0000 mL | ORAL_SOLUTION | Freq: Every evening | ORAL | 0 refills | Status: DC | PRN

## 2021-04-22 MED ORDER — BENZONATATE 100 MG PO CAPS: 100.0000 mg | ORAL_CAPSULE | Freq: Three times a day (TID) | ORAL | 0 refills | Status: DC | PRN

## 2021-04-22 MED ORDER — LEVOFLOXACIN 750 MG PO TABS: 750.0000 mg | ORAL_TABLET | Freq: Every day | ORAL | 0 refills | Status: DC

## 2021-04-22 NOTE — ED Triage Notes (Signed)
Pt reports cough and congestion on and off x 3 weeks. States breathing treatments gives relief. Pt reports she fished antibiotic 3 days ago fr this symptoms.

## 2021-04-22 NOTE — ED Provider Notes (Signed)
[facility]   MRN: [id number] DOB: 09/08/1935  Subjective:   Stacy Franco is a 86 y.o. female with pmh of COPD presenting for 3-week history of persistent coughing, congestion and postnasal drainage.  She was seen at a different practice and prescribed an azithromycin course which she finished 3 days ago but still has the same symptoms. She uses oxygen at home as needed at bedtime. Has also been using her breathing treatments. Denies ever smoking.  No active chest pain.  Has a history of diabetes but does not get any treatment for currently.  Blood sugars have been within normal limits.  No current facility-administered medications for this encounter.  Current Outpatient Medications:    albuterol (PROVENTIL) (2.5 MG/3ML) 0.083% nebulizer solution, Take 2.5 mg by nebulization every 6 (six) hours as needed for wheezing or shortness of breath., Disp: , Rfl:    diazepam (VALIUM) 5 MG tablet, Take 5 mg by mouth every 6 (six) hours as needed for anxiety., Disp: , Rfl:    EUTHYROX 100 MCG tablet, Take 100 mcg by mouth daily., Disp: , Rfl:    furosemide (LASIX) 20 MG tablet, Take 20 mg by mouth daily as needed for fluid., Disp: , Rfl:    Guaifenesin (MUCINEX MAXIMUM STRENGTH) 1200 MG TB12, Take 1 tablet (1,200 mg total) by mouth every 12 (twelve) hours as needed., Disp: 14 tablet, Rfl: 1   levocetirizine (XYZAL) 5 MG tablet, Take 5 mg by mouth daily as needed., Disp: , Rfl:    levothyroxine (SYNTHROID, LEVOTHROID) 88 MCG tablet, Take 88 mcg by mouth daily before breakfast., Disp: , Rfl:    Meloxicam 5 MG CAPS, Take 5 mg by mouth daily. Do not mix with other NSAIDs, Disp: 30 capsule, Rfl: 0   metoprolol (LOPRESSOR) 50 MG tablet, Take 25 mg by mouth 2 (two) times daily. , Disp: , Rfl:    montelukast (SINGULAIR) 10 MG tablet, Take 10 mg by mouth daily., Disp: , Rfl:    tizanidine (ZANAFLEX) 2 MG capsule, Take 1 capsule (2 mg total) by mouth at bedtime as needed for muscle spasms.,  Disp: 20 capsule, Rfl: 0   Allergies  Allergen Reactions   Morphine Rash    Other reaction(s): Other (See Comments) "Almost killed me"   Morphine And Related Rash and Other (See Comments)    "Almost killed me"   Penicillins Shortness Of Breath and Rash   Shellfish Allergy Shortness Of Breath and Swelling   Sulfa Antibiotics Shortness Of Breath and Rash   Codeine Hives    Past Medical History:  Diagnosis Date   Cataracts, bilateral    Diabetes mellitus    Hypertension    Sleep apnea    Thyroid disease      Past Surgical History:  Procedure Laterality Date   ABDOMINAL HYSTERECTOMY     bunyun     eye implants     OVARIAN CYST REMOVAL     TONSILLECTOMY      Family History  Problem Relation Age of Onset   Cirrhosis Brother    Lung cancer Brother    Colon cancer Mother     Social History   Tobacco Use   Smoking status: Never   Smokeless tobacco: Never  Substance Use Topics   Alcohol use: No   Drug use: No    ROS   Objective:   Vitals: BP 129/74 (BP Location: Right Arm)    Pulse 89    Temp 98.5 F (36.9 C) (Oral)  Resp (!) 25    SpO2 94%   Physical Exam Constitutional:      General: She is not in acute distress.    Appearance: Normal appearance. She is well-developed. She is not ill-appearing, toxic-appearing or diaphoretic.  HENT:     Head: Normocephalic and atraumatic.     Nose: Nose normal.     Mouth/Throat:     Mouth: Mucous membranes are moist.  Eyes:     General: No scleral icterus.       Right eye: No discharge.        Left eye: No discharge.     Extraocular Movements: Extraocular movements intact.  Cardiovascular:     Rate and Rhythm: Normal rate.     Heart sounds: No murmur heard.   No friction rub. No gallop.  Pulmonary:     Effort: Pulmonary effort is normal. No respiratory distress.     Breath sounds: No stridor. Decreased breath sounds present. No wheezing, rhonchi or rales.  Chest:     Chest wall: No tenderness.  Skin:     General: Skin is warm and dry.  Neurological:     General: No focal deficit present.     Mental Status: She is alert and oriented to person, place, and time.  Psychiatric:        Mood and Affect: Mood normal.        Behavior: Behavior normal.    DG Chest 2 View  Result Date: 04/22/2021 CLINICAL DATA:  Persistent cough EXAM: CHEST - 2 VIEW COMPARISON:  11/15/2020 FINDINGS: Cardiomegaly. Mild, diffuse interstitial opacity. Disc degenerative disease of the thoracic spine. IMPRESSION: Cardiomegaly with mild, diffuse interstitial pulmonary opacity, consistent with edema and or atypical/viral infection. No focal airspace opacity. Electronically Signed   By: Delanna Ahmadi M.D.   On: 04/22/2021 12:30     Assessment and Plan :   PDMP not reviewed this encounter.  1. Atypical pneumonia   2. Persistent cough   3. Shortness of breath   4. Chronic obstructive pulmonary disease, unspecified COPD type ([place])    Emphasized need to continue with breathing treatments.  Over read was pending at the time of discharge.  I advised that I would call with the results.  Given that she has signs of diffuse interstitial pulmonary opacity suspicious for atypical versus viral infection and or edema I am prescribing levofloxacin.  She failed azithromycin and cannot tolerate penicillins or doxycycline.  I will hold off on a steroid course due to the possibility of pulmonary edema.  I did review strict ER precautions with patient in clinic. Counseled patient on potential for adverse effects with medications prescribed/recommended today, ER and return-to-clinic precautions discussed, patient verbalized understanding.    Jaynee Eagles, PA-C 04/22/21 1246

## 2021-04-23 DIAGNOSIS — J309 Allergic rhinitis, unspecified: Secondary | ICD-10-CM | POA: Diagnosis not present

## 2021-04-23 DIAGNOSIS — Z6836 Body mass index (BMI) 36.0-36.9, adult: Secondary | ICD-10-CM | POA: Diagnosis not present

## 2021-04-23 DIAGNOSIS — J069 Acute upper respiratory infection, unspecified: Secondary | ICD-10-CM | POA: Diagnosis not present

## 2021-04-23 DIAGNOSIS — J4520 Mild intermittent asthma, uncomplicated: Secondary | ICD-10-CM | POA: Diagnosis not present

## 2021-04-23 DIAGNOSIS — E669 Obesity, unspecified: Secondary | ICD-10-CM | POA: Diagnosis not present

## 2021-05-15 DIAGNOSIS — R0902 Hypoxemia: Secondary | ICD-10-CM | POA: Diagnosis not present

## 2021-06-12 DIAGNOSIS — R0902 Hypoxemia: Secondary | ICD-10-CM | POA: Diagnosis not present

## 2021-07-13 DIAGNOSIS — R0902 Hypoxemia: Secondary | ICD-10-CM | POA: Diagnosis not present

## 2021-08-12 DIAGNOSIS — R0902 Hypoxemia: Secondary | ICD-10-CM | POA: Diagnosis not present

## 2021-09-12 DIAGNOSIS — R0902 Hypoxemia: Secondary | ICD-10-CM | POA: Diagnosis not present

## 2021-09-17 DIAGNOSIS — J301 Allergic rhinitis due to pollen: Secondary | ICD-10-CM | POA: Diagnosis not present

## 2021-09-17 DIAGNOSIS — M1990 Unspecified osteoarthritis, unspecified site: Secondary | ICD-10-CM | POA: Diagnosis not present

## 2021-09-17 DIAGNOSIS — Z6837 Body mass index (BMI) 37.0-37.9, adult: Secondary | ICD-10-CM | POA: Diagnosis not present

## 2021-09-17 DIAGNOSIS — E039 Hypothyroidism, unspecified: Secondary | ICD-10-CM | POA: Diagnosis not present

## 2021-09-17 DIAGNOSIS — Z87892 Personal history of anaphylaxis: Secondary | ICD-10-CM | POA: Diagnosis not present

## 2021-09-17 DIAGNOSIS — R42 Dizziness and giddiness: Secondary | ICD-10-CM | POA: Diagnosis not present

## 2021-09-17 DIAGNOSIS — J449 Chronic obstructive pulmonary disease, unspecified: Secondary | ICD-10-CM | POA: Diagnosis not present

## 2021-09-17 DIAGNOSIS — I10 Essential (primary) hypertension: Secondary | ICD-10-CM | POA: Diagnosis not present

## 2021-09-17 DIAGNOSIS — Z008 Encounter for other general examination: Secondary | ICD-10-CM | POA: Diagnosis not present

## 2021-09-17 DIAGNOSIS — E119 Type 2 diabetes mellitus without complications: Secondary | ICD-10-CM | POA: Diagnosis not present

## 2021-09-17 DIAGNOSIS — Z809 Family history of malignant neoplasm, unspecified: Secondary | ICD-10-CM | POA: Diagnosis not present

## 2021-09-17 DIAGNOSIS — R32 Unspecified urinary incontinence: Secondary | ICD-10-CM | POA: Diagnosis not present

## 2021-10-12 DIAGNOSIS — R0902 Hypoxemia: Secondary | ICD-10-CM | POA: Diagnosis not present

## 2021-11-12 DIAGNOSIS — R0902 Hypoxemia: Secondary | ICD-10-CM | POA: Diagnosis not present

## 2021-12-13 DIAGNOSIS — R0902 Hypoxemia: Secondary | ICD-10-CM | POA: Diagnosis not present

## 2022-01-10 DIAGNOSIS — H5203 Hypermetropia, bilateral: Secondary | ICD-10-CM | POA: Diagnosis not present

## 2022-01-12 DIAGNOSIS — R0902 Hypoxemia: Secondary | ICD-10-CM | POA: Diagnosis not present

## 2022-01-22 DIAGNOSIS — M2022 Hallux rigidus, left foot: Secondary | ICD-10-CM | POA: Diagnosis not present

## 2022-01-22 DIAGNOSIS — M722 Plantar fascial fibromatosis: Secondary | ICD-10-CM | POA: Diagnosis not present

## 2022-02-01 DIAGNOSIS — Z0001 Encounter for general adult medical examination with abnormal findings: Secondary | ICD-10-CM | POA: Diagnosis not present

## 2022-02-01 DIAGNOSIS — E119 Type 2 diabetes mellitus without complications: Secondary | ICD-10-CM | POA: Diagnosis not present

## 2022-02-01 DIAGNOSIS — E7849 Other hyperlipidemia: Secondary | ICD-10-CM | POA: Diagnosis not present

## 2022-02-01 DIAGNOSIS — J4520 Mild intermittent asthma, uncomplicated: Secondary | ICD-10-CM | POA: Diagnosis not present

## 2022-02-01 DIAGNOSIS — Z6836 Body mass index (BMI) 36.0-36.9, adult: Secondary | ICD-10-CM | POA: Diagnosis not present

## 2022-02-01 DIAGNOSIS — E039 Hypothyroidism, unspecified: Secondary | ICD-10-CM | POA: Diagnosis not present

## 2022-02-01 DIAGNOSIS — G4730 Sleep apnea, unspecified: Secondary | ICD-10-CM | POA: Diagnosis not present

## 2022-02-01 DIAGNOSIS — F419 Anxiety disorder, unspecified: Secondary | ICD-10-CM | POA: Diagnosis not present

## 2022-02-01 DIAGNOSIS — J449 Chronic obstructive pulmonary disease, unspecified: Secondary | ICD-10-CM | POA: Diagnosis not present

## 2022-02-01 DIAGNOSIS — R69 Illness, unspecified: Secondary | ICD-10-CM | POA: Diagnosis not present

## 2022-02-01 DIAGNOSIS — E782 Mixed hyperlipidemia: Secondary | ICD-10-CM | POA: Diagnosis not present

## 2022-02-01 DIAGNOSIS — Z1331 Encounter for screening for depression: Secondary | ICD-10-CM | POA: Diagnosis not present

## 2022-02-01 DIAGNOSIS — E559 Vitamin D deficiency, unspecified: Secondary | ICD-10-CM | POA: Diagnosis not present

## 2022-02-01 DIAGNOSIS — I10 Essential (primary) hypertension: Secondary | ICD-10-CM | POA: Diagnosis not present

## 2022-02-01 DIAGNOSIS — E118 Type 2 diabetes mellitus with unspecified complications: Secondary | ICD-10-CM | POA: Diagnosis not present

## 2022-02-12 DIAGNOSIS — R0902 Hypoxemia: Secondary | ICD-10-CM | POA: Diagnosis not present

## 2022-03-14 DIAGNOSIS — R0902 Hypoxemia: Secondary | ICD-10-CM | POA: Diagnosis not present

## 2022-03-18 DIAGNOSIS — J40 Bronchitis, not specified as acute or chronic: Secondary | ICD-10-CM | POA: Diagnosis not present

## 2022-03-18 DIAGNOSIS — J449 Chronic obstructive pulmonary disease, unspecified: Secondary | ICD-10-CM | POA: Diagnosis not present

## 2022-03-18 DIAGNOSIS — Z9981 Dependence on supplemental oxygen: Secondary | ICD-10-CM | POA: Diagnosis not present

## 2022-03-18 DIAGNOSIS — Z20828 Contact with and (suspected) exposure to other viral communicable diseases: Secondary | ICD-10-CM | POA: Diagnosis not present

## 2022-03-18 DIAGNOSIS — Z6836 Body mass index (BMI) 36.0-36.9, adult: Secondary | ICD-10-CM | POA: Diagnosis not present

## 2022-03-18 DIAGNOSIS — E6609 Other obesity due to excess calories: Secondary | ICD-10-CM | POA: Diagnosis not present

## 2022-04-14 DIAGNOSIS — R0902 Hypoxemia: Secondary | ICD-10-CM | POA: Diagnosis not present

## 2022-04-19 IMAGING — DX DG CHEST 2V
2 series · 2 of 2 positions shown · non-contrast
Comparison: 11/15/2020

CLINICAL DATA: Persistent cough

EXAM:
CHEST - 2 VIEW

[chest pa]
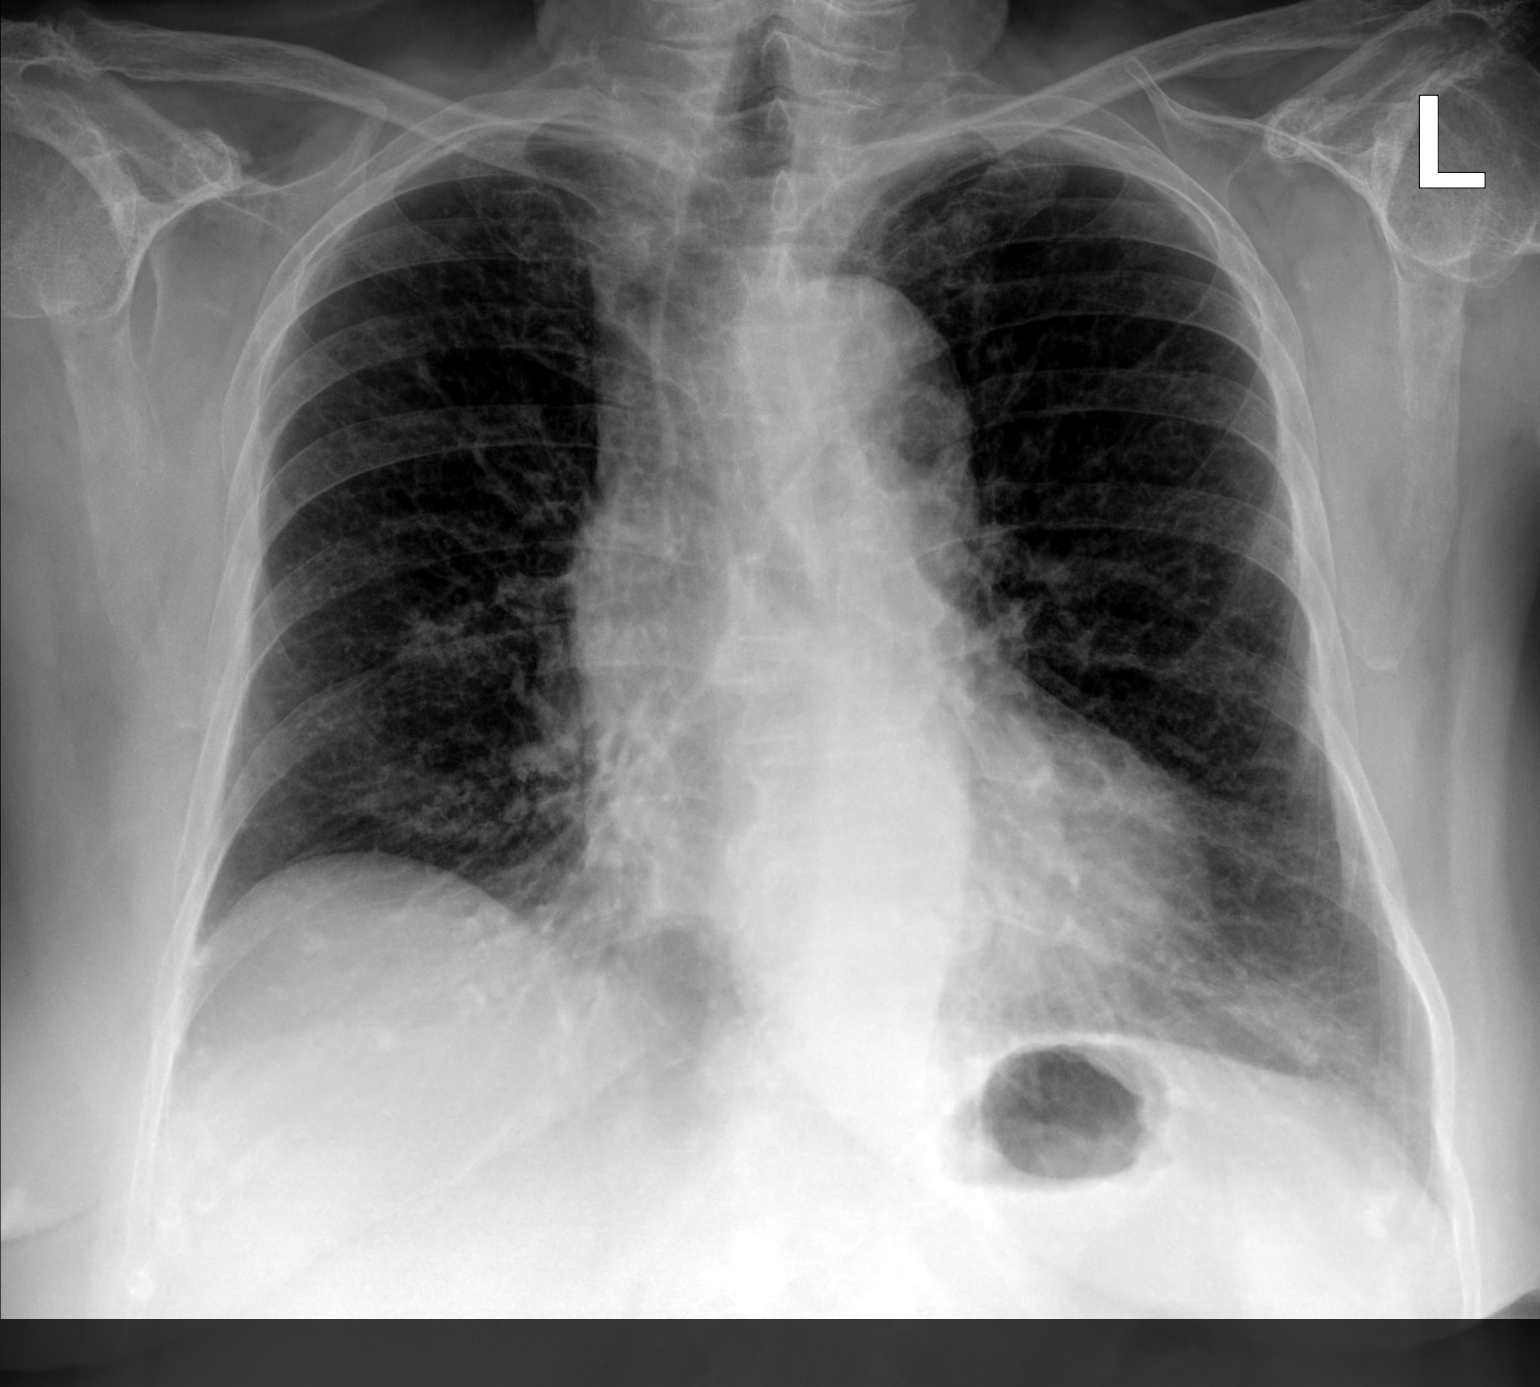

[chest lat]
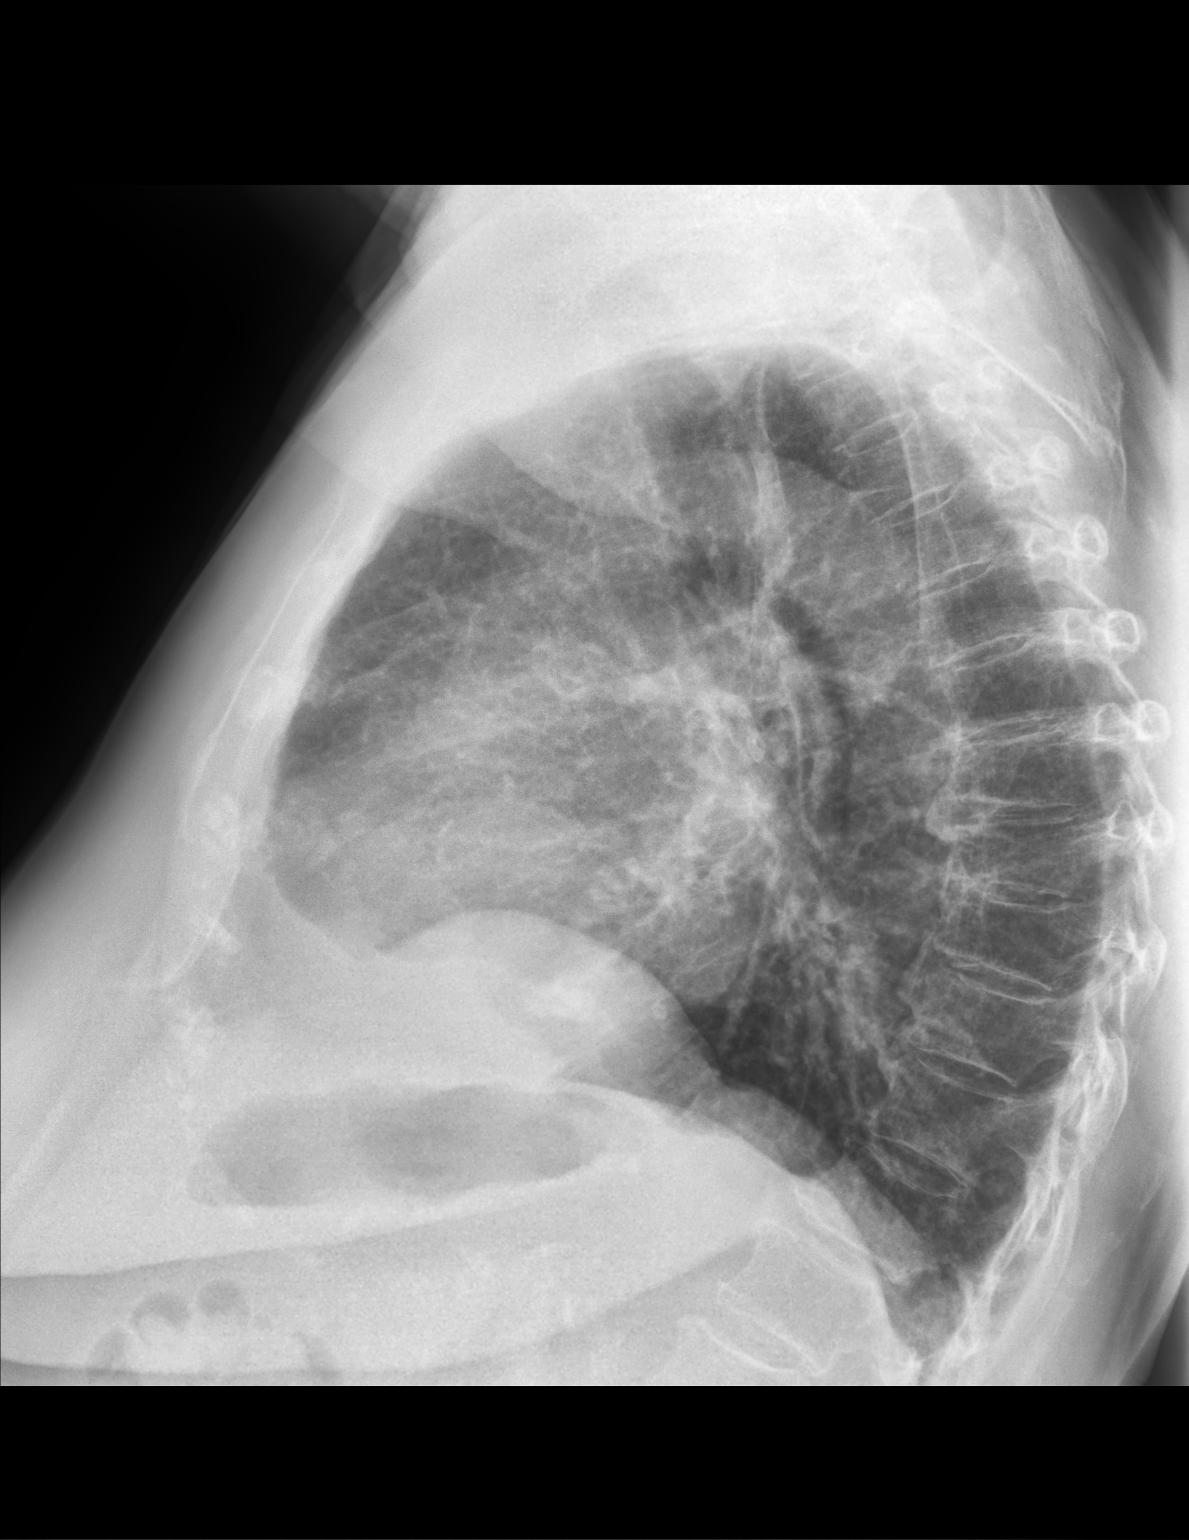

[2 of 2 positions shown; findings below may reference images not displayed]

FINDINGS: Cardiomegaly. Mild, diffuse interstitial opacity. Disc degenerative
disease of the thoracic spine.
IMPRESSION: Cardiomegaly with mild, diffuse interstitial pulmonary opacity,
consistent with edema and or atypical/viral infection. No focal
airspace opacity.

## 2022-05-06 ENCOUNTER — Ambulatory Visit
Admission: EM | Admit: 2022-05-06 | Discharge: 2022-05-06 | Disposition: A | Discharge status: Home / Self Care | Payer: Medicare HMO | Attending: Nurse Practitioner | Admitting: Nurse Practitioner

## 2022-05-06 DIAGNOSIS — R300 Dysuria: Secondary | ICD-10-CM

## 2022-05-06 LAB — POCT URINALYSIS DIP (MANUAL ENTRY)
Bilirubin, UA: NEGATIVE
Blood, UA: NEGATIVE
Glucose, UA: NEGATIVE mg/dL
Ketones, POC UA: NEGATIVE mg/dL
Leukocytes, UA: NEGATIVE
Nitrite, UA: NEGATIVE
Protein Ur, POC: NEGATIVE mg/dL
Spec Grav, UA: 1.015 (ref 1.010–1.025)
Urobilinogen, UA: 0.2 E.U./dL
pH, UA: 6 (ref 5.0–8.0)

## 2022-05-06 NOTE — Discharge Instructions (Addendum)
The urine sample today does not show any signs of a urinary tract infection.  Will send for urine culture and call you in a couple of days if this shows we need to treat you with antibiotic therapy.  In the meantime, make sure you are taking in plenty of liquids like water or electrolyte solutions.

## 2022-05-06 NOTE — ED Provider Notes (Signed)
[facility]    CSN: [id number] Arrival date & time: 05/06/22  1112      History   Chief Complaint No chief complaint on file.   HPI Stacy Franco is a 87 y.o. female.   Patient presents today with granddaughter for a few days of "irritation" when urinating.  She denies urinary frequency, urinary urgency, voiding smaller amounts, new urinary incontinence, foul urinary odor, hematuria, suprapubic pain/pressure, flank pain, fever, body aches or chills, and nausea/vomiting.  No change in appetite.  No vaginal discharge.  Reports it only is occasional when she feels irritation when urinating.  She does have abdominal pain across her umbilicus.  Also has back pain in her right lower back, suffers from chronic bilateral low back pain.  Has been drinking cranberry juice and forcing herself to drink more water which seems to have helped with her symptoms minimally.  She is concerned because she wears feminine pads for chronic urinary incontinence and thinks this may have caused irritation.    Past Medical History:  Diagnosis Date   Cataracts, bilateral    Diabetes mellitus    Hypertension    Sleep apnea    Thyroid disease     There are no problems to display for this patient.   Past Surgical History:  Procedure Laterality Date   ABDOMINAL HYSTERECTOMY     bunyun     eye implants     OVARIAN CYST REMOVAL     TONSILLECTOMY      OB History     Gravida  5   Para  5   Term  5   Preterm      AB      Living  5      SAB      IAB      Ectopic      Multiple      Live Births               Home Medications    Prior to Admission medications   Medication Sig Start Date End Date Taking? Authorizing Provider  albuterol (PROVENTIL) (2.5 MG/3ML) 0.083% nebulizer solution Take 2.5 mg by nebulization every 6 (six) hours as needed for wheezing or shortness of breath.    [provider]  diazepam (VALIUM) 5 MG tablet Take 5 mg by mouth every 6  (six) hours as needed for anxiety.    [provider]  EUTHYROX 100 MCG tablet Take 100 mcg by mouth daily. 01/06/20   [provider]  furosemide (LASIX) 20 MG tablet Take 20 mg by mouth daily as needed for fluid.    [provider]  levocetirizine (XYZAL) 5 MG tablet Take 5 mg by mouth daily as needed. 03/27/21   [provider]  levothyroxine (SYNTHROID, LEVOTHROID) 88 MCG tablet Take 88 mcg by mouth daily before breakfast.    [provider]  Meloxicam 5 MG CAPS Take 5 mg by mouth daily. Do not mix with other NSAIDs 03/17/20   Avegno, Darrelyn Hillock, FNP  metoprolol (LOPRESSOR) 50 MG tablet Take 25 mg by mouth 2 (two) times daily.     [provider]  montelukast (SINGULAIR) 10 MG tablet Take 10 mg by mouth daily.    [provider]  tizanidine (ZANAFLEX) 2 MG capsule Take 1 capsule (2 mg total) by mouth at bedtime as needed for muscle spasms. 07/01/20   Lestine Box, PA-C    Family History Family History  Problem Relation Age of  Onset   Cirrhosis Brother    Lung cancer Brother    Colon cancer Mother     Social History Social History   Tobacco Use   Smoking status: Never   Smokeless tobacco: Never  Substance Use Topics   Alcohol use: No   Drug use: No     Allergies   Morphine, Morphine and related, Penicillins, Shellfish allergy, Sulfa antibiotics, and Codeine   Review of Systems Review of Systems Per HPI  Physical Exam Triage Vital Signs ED Triage Vitals  Enc Vitals Group     BP 05/06/22 1148 (!) 181/84     Pulse Rate 05/06/22 1148 70     Resp 05/06/22 1148 18     Temp 05/06/22 1148 98.3 F (36.8 C)     Temp Source 05/06/22 1148 Oral     SpO2 05/06/22 1148 92 %     Weight --      Height --      Head Circumference --      Peak Flow --      Pain Score 05/06/22 1151 7     Pain Loc --      Pain Edu? --      Excl. in [place]? --    No data found.  Updated Vital Signs BP (!) 181/84 (BP Location: Right  Arm)   Pulse 70   Temp 98.3 F (36.8 C) (Oral)   Resp 18   SpO2 92%   Visual Acuity Right Eye Distance:   Left Eye Distance:   Bilateral Distance:    Right Eye Near:   Left Eye Near:    Bilateral Near:     Physical Exam Vitals and nursing note reviewed.  Constitutional:      General: She is not in acute distress.    Appearance: She is not toxic-appearing.  Pulmonary:     Effort: Pulmonary effort is normal. No respiratory distress.  Abdominal:     General: Abdomen is flat. Bowel sounds are normal. There is no distension.     Palpations: Abdomen is soft. There is no mass.     Tenderness: There is no abdominal tenderness. There is no right CVA tenderness, left CVA tenderness or guarding.  Skin:    General: Skin is warm and dry.     Coloration: Skin is not jaundiced or pale.     Findings: No erythema.  Neurological:     Mental Status: She is alert and oriented to person, place, and time.     Motor: No weakness.     Gait: Gait normal.  Psychiatric:        Behavior: Behavior is cooperative.      UC Treatments / Results  Labs (all labs ordered are listed, but only abnormal results are displayed) Labs Reviewed  URINE CULTURE  POCT URINALYSIS DIP (MANUAL ENTRY)    EKG   Radiology No results found.  Procedures Procedures (including critical care time)  Medications Ordered in UC Medications - No data to display  Initial Impression / Assessment and Plan / UC Course  I have reviewed the triage vital signs and the nursing notes.  Pertinent labs & imaging results that were available during my care of the patient were reviewed by me and considered in my medical decision making (see chart for details).   Patient is well-appearing, afebrile, not tachycardic, not tachypneic, oxygenating well on room air.  Patient is hypertensive today in urgent care, likely secondary to missing morning medication.  1. Dysuria Urinalysis  today is unremarkable; given symptoms, will  send for urine culture and treat as indicated Discussed vaginal hygiene with patient and recommended changing incontinent pads more frequently Supportive care discussed ER and return precautions also discussed  The patient was given the opportunity to ask questions.  All questions answered to their satisfaction.  The patient is in agreement to this plan.   Final Clinical Impressions(s) / UC Diagnoses   Final diagnoses:  Dysuria     Discharge Instructions      The urine sample today does not show any signs of a urinary tract infection.  Will send for urine culture and call you in a couple of days if this shows we need to treat you with antibiotic therapy.  In the meantime, make sure you are taking in plenty of liquids like water or electrolyte solutions.    ED Prescriptions   None    PDMP not reviewed this encounter.   Eulogio Bear, NP 05/06/22 1235

## 2022-05-06 NOTE — ED Triage Notes (Signed)
Pt reports low back pain, low abdominal pain, burning when urinating, more frequent urination, and urgency x 3 days

## 2022-05-07 LAB — URINE CULTURE: Culture: NO GROWTH

## 2022-05-15 DIAGNOSIS — R0902 Hypoxemia: Secondary | ICD-10-CM | POA: Diagnosis not present

## 2022-06-13 DIAGNOSIS — R0902 Hypoxemia: Secondary | ICD-10-CM | POA: Diagnosis not present

## 2022-07-14 DIAGNOSIS — R0902 Hypoxemia: Secondary | ICD-10-CM | POA: Diagnosis not present

## 2022-07-30 DIAGNOSIS — I10 Essential (primary) hypertension: Secondary | ICD-10-CM | POA: Diagnosis not present

## 2022-07-30 DIAGNOSIS — E782 Mixed hyperlipidemia: Secondary | ICD-10-CM | POA: Diagnosis not present

## 2022-07-30 DIAGNOSIS — E118 Type 2 diabetes mellitus with unspecified complications: Secondary | ICD-10-CM | POA: Diagnosis not present

## 2022-08-13 DIAGNOSIS — R0902 Hypoxemia: Secondary | ICD-10-CM | POA: Diagnosis not present

## 2022-09-01 ENCOUNTER — Ambulatory Visit
Admission: EM | Admit: 2022-09-01 | Discharge: 2022-09-01 | Disposition: A | Discharge status: Home / Self Care | Payer: Medicare HMO | Attending: Nurse Practitioner | Admitting: Nurse Practitioner

## 2022-09-01 DIAGNOSIS — S90461A Insect bite (nonvenomous), right great toe, initial encounter: Secondary | ICD-10-CM | POA: Diagnosis not present

## 2022-09-01 DIAGNOSIS — S30861A Insect bite (nonvenomous) of abdominal wall, initial encounter: Secondary | ICD-10-CM

## 2022-09-01 DIAGNOSIS — W57XXXA Bitten or stung by nonvenomous insect and other nonvenomous arthropods, initial encounter: Secondary | ICD-10-CM

## 2022-09-01 MED ORDER — MUPIROCIN 2 % EX OINT: 1.0000 | TOPICAL_OINTMENT | Freq: Two times a day (BID) | CUTANEOUS | 0 refills | Status: AC

## 2022-09-01 MED ORDER — DOXYCYCLINE HYCLATE 100 MG PO CAPS: 200.0000 mg | ORAL_CAPSULE | Freq: Once | ORAL | 0 refills | Status: AC

## 2022-09-01 MED ORDER — TRIAMCINOLONE ACETONIDE 0.1 % EX OINT: 1.0000 | TOPICAL_OINTMENT | Freq: Two times a day (BID) | CUTANEOUS | 0 refills | Status: AC

## 2022-09-01 NOTE — ED Provider Notes (Signed)
[facility]    CSN: [id number] Arrival date & time: 09/01/22  1146      History   Chief Complaint Chief Complaint  Patient presents with   Insect Bite    HPI Stacy Franco is a 86 y.o. female.   Patient presents today with multiple tick bites : left side, left shoulder, right great toe, left foot.  Reports the right great toe tick was pulled off yesterday.  She reports she developed a blister that was dark in color and had pus inside of it.  Her daughter "sterilized" it and punctured it and noted pus draining from it.  Reports that tick bite on her side is itchy.  She has been able to remove all the ticks on her own.  She denies fever, chills, change in mental status, new headache, neck stiffness.  Denies rash around the tick bites.  Reports she has bodyaches at baseline and the family recently got over a upper respiratory illness.  Body aches do not seem to be worse than normal.  Has been applying Neosporin to the right great toe.    Past Medical History:  Diagnosis Date   Cataracts, bilateral    Diabetes mellitus    Hypertension    Sleep apnea    Thyroid disease     There are no problems to display for this patient.   Past Surgical History:  Procedure Laterality Date   ABDOMINAL HYSTERECTOMY     bunyun     eye implants     OVARIAN CYST REMOVAL     TONSILLECTOMY      OB History     Gravida  5   Para  5   Term  5   Preterm      AB      Living  5      SAB      IAB      Ectopic      Multiple      Live Births               Home Medications    Prior to Admission medications   Medication Sig Start Date End Date Taking? Authorizing Provider  doxycycline (VIBRAMYCIN) 100 MG capsule Take 2 capsules (200 mg total) by mouth once for 1 dose. 09/01/22 09/01/22 Yes Valentino Nose, NP  mupirocin ointment (BACTROBAN) 2 % Apply 1 Application topically 2 (two) times daily. 09/01/22  Yes Cathlean Marseilles A, NP  triamcinolone ointment  (KENALOG) 0.1 % Apply 1 Application topically 2 (two) times daily. 09/01/22  Yes Cathlean Marseilles A, NP  albuterol (PROVENTIL) (2.5 MG/3ML) 0.083% nebulizer solution Take 2.5 mg by nebulization every 6 (six) hours as needed for wheezing or shortness of breath.    [provider]  diazepam (VALIUM) 5 MG tablet Take 5 mg by mouth every 6 (six) hours as needed for anxiety.    [provider]  EUTHYROX 100 MCG tablet Take 100 mcg by mouth daily. 01/06/20   [provider]  furosemide (LASIX) 20 MG tablet Take 20 mg by mouth daily as needed for fluid.    [provider]  levocetirizine (XYZAL) 5 MG tablet Take 5 mg by mouth daily as needed. 03/27/21   [provider]  levothyroxine (SYNTHROID, LEVOTHROID) 88 MCG tablet Take 88 mcg by mouth daily before breakfast.    [provider]  Meloxicam 5 MG CAPS Take 5 mg by mouth daily. Do not mix with other NSAIDs 03/17/20  Durward Parcel, FNP  metoprolol (LOPRESSOR) 50 MG tablet Take 25 mg by mouth 2 (two) times daily.     [provider]  montelukast (SINGULAIR) 10 MG tablet Take 10 mg by mouth daily.    [provider]  tizanidine (ZANAFLEX) 2 MG capsule Take 1 capsule (2 mg total) by mouth at bedtime as needed for muscle spasms. 07/01/20   Rennis Harding, PA-C    Family History Family History  Problem Relation Age of Onset   Cirrhosis Brother    Lung cancer Brother    Colon cancer Mother     Social History Social History   Tobacco Use   Smoking status: Never   Smokeless tobacco: Never  Substance Use Topics   Alcohol use: No   Drug use: No     Allergies   Morphine, Morphine and codeine, Penicillins, Shellfish allergy, Sulfa antibiotics, and Codeine   Review of Systems Review of Systems Per HPI  Physical Exam Triage Vital Signs ED Triage Vitals  Enc Vitals Group     BP 09/01/22 1201 (!) 158/78     Pulse Rate 09/01/22 1201 78     Resp 09/01/22 1201 18      Temp 09/01/22 1201 98 F (36.7 C)     Temp Source 09/01/22 1201 Oral     SpO2 09/01/22 1201 95 %     Weight --      Height --      Head Circumference --      Peak Flow --      Pain Score 09/01/22 1159 7     Pain Loc --      Pain Edu? --      Excl. in GC? --    No data found.  Updated Vital Signs BP (!) 158/78 (BP Location: Right Arm)   Pulse 78   Temp 98 F (36.7 C) (Oral)   Resp 18   SpO2 95%   Visual Acuity Right Eye Distance:   Left Eye Distance:   Bilateral Distance:    Right Eye Near:   Left Eye Near:    Bilateral Near:     Physical Exam Vitals and nursing note reviewed.  Constitutional:      General: She is not in acute distress.    Appearance: Normal appearance. She is not toxic-appearing.  HENT:     Head: Normocephalic and atraumatic.     Mouth/Throat:     Mouth: Mucous membranes are moist.     Pharynx: Oropharynx is clear.  Pulmonary:     Effort: Pulmonary effort is normal. No respiratory distress.  Chest:    Musculoskeletal:     Cervical back: Normal range of motion.       Feet:  Lymphadenopathy:     Cervical: No cervical adenopathy.  Skin:    General: Skin is warm and dry.     Capillary Refill: Capillary refill takes less than 2 seconds.  Neurological:     Mental Status: She is alert and oriented to person, place, and time.  Psychiatric:        Behavior: Behavior is cooperative.      UC Treatments / Results  Labs (all labs ordered are listed, but only abnormal results are displayed) Labs Reviewed - No data to display  EKG   Radiology No results found.  Procedures Procedures (including critical care time)  Medications Ordered in UC Medications - No data to display  Initial Impression / Assessment and Plan / UC Course  I have reviewed the triage vital signs and the nursing notes.  Pertinent labs & imaging results that were available during my care of the patient were reviewed by me and considered in my medical decision  making (see chart for details).   Patient is well-appearing, afebrile, not tachycardic, not tachypneic, oxygenating well on room air.  Patient is mildly hypertensive today in urgent care.  1. Tick bite of right great toe, initial encounter Wound care discussed Start mupirocin ointment; sounds like this area was an abscess that drained yesterday Start warm soaks Prophylactic doxycycline 200 mg 1 dose prescribed today Seek care for persistent or worsening symptoms despite treatment  2. Tick bite of abdominal wall, initial encounter Treat with topical ointment twice daily as needed for itching Supportive care discussed  The patient was given the opportunity to ask questions.  All questions answered to their satisfaction.  The patient is in agreement to this plan.   Final Clinical Impressions(s) / UC Diagnoses   Final diagnoses:  Tick bite of right great toe, initial encounter  Tick bite of abdominal wall, initial encounter     Discharge Instructions      For the tick bite on your toe:  continue warm soaks.  Start applying thin layer of mupirocin ointment twice daily after warm soak.  Apply band aid.  Take prophylactic dose of doxycycline - 200 mg once.    For the tick bite on your side: use the topical Kenalog ointment twice daily applied to clean skin to help with itching.     ED Prescriptions     Medication Sig Dispense Auth. Provider   mupirocin ointment (BACTROBAN) 2 % Apply 1 Application topically 2 (two) times daily. 15 g Cathlean Marseilles A, NP   triamcinolone ointment (KENALOG) 0.1 % Apply 1 Application topically 2 (two) times daily. 15 g Cathlean Marseilles A, NP   doxycycline (VIBRAMYCIN) 100 MG capsule Take 2 capsules (200 mg total) by mouth once for 1 dose. 2 capsule Valentino Nose, NP      PDMP not reviewed this encounter.   Valentino Nose, NP 09/01/22 [phone]

## 2022-09-01 NOTE — ED Triage Notes (Signed)
Pt c/o 4 areas where she found tick bites on her body (left shoulder, left side, left great toe and right inner foot). The patient states she now has pain and irritation to the areas.   Home interventions: epsom salt soak

## 2022-09-01 NOTE — Discharge Instructions (Addendum)
For the tick bite on your toe:  continue warm soaks.  Start applying thin layer of mupirocin ointment twice daily after warm soak.  Apply band aid.  Take prophylactic dose of doxycycline - 200 mg once.    For the tick bite on your side: use the topical Kenalog ointment twice daily applied to clean skin to help with itching.

## 2022-09-13 DIAGNOSIS — R0902 Hypoxemia: Secondary | ICD-10-CM | POA: Diagnosis not present

## 2022-09-13 DIAGNOSIS — E1122 Type 2 diabetes mellitus with diabetic chronic kidney disease: Secondary | ICD-10-CM | POA: Diagnosis not present

## 2022-09-13 DIAGNOSIS — N182 Chronic kidney disease, stage 2 (mild): Secondary | ICD-10-CM | POA: Diagnosis not present

## 2022-09-13 DIAGNOSIS — R32 Unspecified urinary incontinence: Secondary | ICD-10-CM | POA: Diagnosis not present

## 2022-09-13 DIAGNOSIS — I129 Hypertensive chronic kidney disease with stage 1 through stage 4 chronic kidney disease, or unspecified chronic kidney disease: Secondary | ICD-10-CM | POA: Diagnosis not present

## 2022-09-13 DIAGNOSIS — J301 Allergic rhinitis due to pollen: Secondary | ICD-10-CM | POA: Diagnosis not present

## 2022-09-13 DIAGNOSIS — E039 Hypothyroidism, unspecified: Secondary | ICD-10-CM | POA: Diagnosis not present

## 2022-09-13 DIAGNOSIS — G4730 Sleep apnea, unspecified: Secondary | ICD-10-CM | POA: Diagnosis not present

## 2022-09-13 DIAGNOSIS — E785 Hyperlipidemia, unspecified: Secondary | ICD-10-CM | POA: Diagnosis not present

## 2022-09-13 DIAGNOSIS — F325 Major depressive disorder, single episode, in full remission: Secondary | ICD-10-CM | POA: Diagnosis not present

## 2022-09-13 DIAGNOSIS — M1990 Unspecified osteoarthritis, unspecified site: Secondary | ICD-10-CM | POA: Diagnosis not present

## 2022-09-13 DIAGNOSIS — F419 Anxiety disorder, unspecified: Secondary | ICD-10-CM | POA: Diagnosis not present

## 2022-09-26 DIAGNOSIS — M76822 Posterior tibial tendinitis, left leg: Secondary | ICD-10-CM | POA: Diagnosis not present

## 2022-09-26 DIAGNOSIS — M79672 Pain in left foot: Secondary | ICD-10-CM | POA: Diagnosis not present

## 2022-09-26 DIAGNOSIS — S90461A Insect bite (nonvenomous), right great toe, initial encounter: Secondary | ICD-10-CM | POA: Diagnosis not present

## 2022-09-26 DIAGNOSIS — W57XXXA Bitten or stung by nonvenomous insect and other nonvenomous arthropods, initial encounter: Secondary | ICD-10-CM | POA: Diagnosis not present

## 2022-10-02 DIAGNOSIS — H01001 Unspecified blepharitis right upper eyelid: Secondary | ICD-10-CM | POA: Diagnosis not present

## 2022-10-13 DIAGNOSIS — R0902 Hypoxemia: Secondary | ICD-10-CM | POA: Diagnosis not present

## 2022-11-13 DIAGNOSIS — R0902 Hypoxemia: Secondary | ICD-10-CM | POA: Diagnosis not present

## 2022-12-14 DIAGNOSIS — R0902 Hypoxemia: Secondary | ICD-10-CM | POA: Diagnosis not present

## 2023-01-13 DIAGNOSIS — R0902 Hypoxemia: Secondary | ICD-10-CM | POA: Diagnosis not present

## 2023-01-16 DIAGNOSIS — H04123 Dry eye syndrome of bilateral lacrimal glands: Secondary | ICD-10-CM | POA: Diagnosis not present

## 2023-02-13 DIAGNOSIS — R0902 Hypoxemia: Secondary | ICD-10-CM | POA: Diagnosis not present

## 2023-02-14 DIAGNOSIS — Z1331 Encounter for screening for depression: Secondary | ICD-10-CM | POA: Diagnosis not present

## 2023-02-14 DIAGNOSIS — E782 Mixed hyperlipidemia: Secondary | ICD-10-CM | POA: Diagnosis not present

## 2023-02-14 DIAGNOSIS — Z0001 Encounter for general adult medical examination with abnormal findings: Secondary | ICD-10-CM | POA: Diagnosis not present

## 2023-02-14 DIAGNOSIS — E039 Hypothyroidism, unspecified: Secondary | ICD-10-CM | POA: Diagnosis not present

## 2023-02-14 DIAGNOSIS — E1159 Type 2 diabetes mellitus with other circulatory complications: Secondary | ICD-10-CM | POA: Diagnosis not present

## 2023-02-14 DIAGNOSIS — Z6836 Body mass index (BMI) 36.0-36.9, adult: Secondary | ICD-10-CM | POA: Diagnosis not present

## 2023-02-14 DIAGNOSIS — I10 Essential (primary) hypertension: Secondary | ICD-10-CM | POA: Diagnosis not present

## 2023-02-14 DIAGNOSIS — E559 Vitamin D deficiency, unspecified: Secondary | ICD-10-CM | POA: Diagnosis not present

## 2023-02-14 DIAGNOSIS — E6609 Other obesity due to excess calories: Secondary | ICD-10-CM | POA: Diagnosis not present

## 2023-03-15 DIAGNOSIS — R0902 Hypoxemia: Secondary | ICD-10-CM | POA: Diagnosis not present

## 2023-04-01 DIAGNOSIS — E039 Hypothyroidism, unspecified: Secondary | ICD-10-CM | POA: Diagnosis not present

## 2023-04-01 DIAGNOSIS — J449 Chronic obstructive pulmonary disease, unspecified: Secondary | ICD-10-CM | POA: Diagnosis not present

## 2023-04-01 DIAGNOSIS — I10 Essential (primary) hypertension: Secondary | ICD-10-CM | POA: Diagnosis not present

## 2023-04-15 DIAGNOSIS — R0902 Hypoxemia: Secondary | ICD-10-CM | POA: Diagnosis not present

## 2023-05-02 DIAGNOSIS — E039 Hypothyroidism, unspecified: Secondary | ICD-10-CM | POA: Diagnosis not present

## 2023-05-02 DIAGNOSIS — E1159 Type 2 diabetes mellitus with other circulatory complications: Secondary | ICD-10-CM | POA: Diagnosis not present

## 2023-05-16 DIAGNOSIS — R0902 Hypoxemia: Secondary | ICD-10-CM | POA: Diagnosis not present

## 2023-05-22 DIAGNOSIS — Z6837 Body mass index (BMI) 37.0-37.9, adult: Secondary | ICD-10-CM | POA: Diagnosis not present

## 2023-05-22 DIAGNOSIS — J40 Bronchitis, not specified as acute or chronic: Secondary | ICD-10-CM | POA: Diagnosis not present

## 2023-05-22 DIAGNOSIS — E6609 Other obesity due to excess calories: Secondary | ICD-10-CM | POA: Diagnosis not present

## 2023-05-22 DIAGNOSIS — J302 Other seasonal allergic rhinitis: Secondary | ICD-10-CM | POA: Diagnosis not present

## 2023-05-22 DIAGNOSIS — E1159 Type 2 diabetes mellitus with other circulatory complications: Secondary | ICD-10-CM | POA: Diagnosis not present

## 2023-05-22 DIAGNOSIS — Z20828 Contact with and (suspected) exposure to other viral communicable diseases: Secondary | ICD-10-CM | POA: Diagnosis not present

## 2023-05-22 DIAGNOSIS — J4520 Mild intermittent asthma, uncomplicated: Secondary | ICD-10-CM | POA: Diagnosis not present

## 2023-06-13 DIAGNOSIS — R0902 Hypoxemia: Secondary | ICD-10-CM | POA: Diagnosis not present

## 2023-07-14 DIAGNOSIS — R0902 Hypoxemia: Secondary | ICD-10-CM | POA: Diagnosis not present

## 2023-08-13 DIAGNOSIS — R0902 Hypoxemia: Secondary | ICD-10-CM | POA: Diagnosis not present

## 2023-08-18 ENCOUNTER — Ambulatory Visit
Admission: EM | Admit: 2023-08-18 | Discharge: 2023-08-18 | Disposition: A | Discharge status: Home / Self Care | Attending: Family Medicine | Admitting: Family Medicine

## 2023-08-18 DIAGNOSIS — I160 Hypertensive urgency: Secondary | ICD-10-CM | POA: Diagnosis not present

## 2023-08-18 DIAGNOSIS — W57XXXA Bitten or stung by nonvenomous insect and other nonvenomous arthropods, initial encounter: Secondary | ICD-10-CM

## 2023-08-18 DIAGNOSIS — S1086XA Insect bite of other specified part of neck, initial encounter: Secondary | ICD-10-CM

## 2023-08-18 HISTORY — DX: Chronic obstructive pulmonary disease, unspecified: J44.9

## 2023-08-18 MED ORDER — TRIAMCINOLONE ACETONIDE 0.1 % EX CREA: 1.0000 | TOPICAL_CREAM | Freq: Two times a day (BID) | CUTANEOUS | 0 refills | Status: AC

## 2023-08-18 NOTE — Discharge Instructions (Signed)
 As discussed, I am concerned about how elevated your blood pressure is.  Take your medication when you get home as prescribed and continue keeping track on it.  Call your primary care provider if still elevated by tomorrow.  For any worsening symptoms at any time go to the emergency department.

## 2023-08-18 NOTE — ED Triage Notes (Addendum)
 Pt reports she has a tick bite on her neck  and her right arm x 1 day  Pt complains of right side neck pain

## 2023-08-21 NOTE — ED Provider Notes (Signed)
 [facility]    CSN: [id number] Arrival date & time: 08/18/23  1651      History   Chief Complaint No chief complaint on file.   HPI Stacy Franco is a 88 y.o. female.   Patient presenting today with 1 day history of an itchy inflamed area where she pulled a tick off of herself yesterday.  She states that side of her neck is aching and painful and bothering her greatly.  Denies fever, chills, nausea, vomiting, generalized bodyaches, rashes elsewhere.  So far trying triamcinolone  since last night with minimal relief.    Past Medical History:  Diagnosis Date   Cataracts, bilateral    COPD (chronic obstructive pulmonary disease) (HCC)    Diabetes mellitus    Hypertension    Sleep apnea    Thyroid  disease     There are no active problems to display for this patient.   Past Surgical History:  Procedure Laterality Date   ABDOMINAL HYSTERECTOMY     bunyun     eye implants     OVARIAN CYST REMOVAL     TONSILLECTOMY      OB History     Gravida  5   Para  5   Term  5   Preterm      AB      Living  5      SAB      IAB      Ectopic      Multiple      Live Births               Home Medications    Prior to Admission medications   Medication Sig Start Date End Date Taking? Authorizing Provider  triamcinolone  cream (KENALOG ) 0.1 % Apply 1 Application topically 2 (two) times daily. 08/18/23  Yes Corbin Dess, PA-C  albuterol  (PROVENTIL ) (2.5 MG/3ML) 0.083% nebulizer solution Take 2.5 mg by nebulization every 6 (six) hours as needed for wheezing or shortness of breath.    [provider]  diazepam (VALIUM) 5 MG tablet Take 5 mg by mouth every 6 (six) hours as needed for anxiety.    [provider]  EUTHYROX 100 MCG tablet Take 100 mcg by mouth daily. 01/06/20   [provider]  furosemide (LASIX) 20 MG tablet Take 20 mg by mouth daily as needed for fluid.    [provider]  levocetirizine  (XYZAL) 5 MG tablet Take 5 mg by mouth daily as needed. 03/27/21   [provider]  levothyroxine (SYNTHROID, LEVOTHROID) 88 MCG tablet Take 88 mcg by mouth daily before breakfast.    [provider]  Meloxicam  5 MG CAPS Take 5 mg by mouth daily. Do not mix with other NSAIDs 03/17/20   Avegno, Komlanvi S, FNP  metoprolol (LOPRESSOR) 50 MG tablet Take 25 mg by mouth 2 (two) times daily.     [provider]  montelukast (SINGULAIR) 10 MG tablet Take 10 mg by mouth daily.    [provider]  mupirocin  ointment (BACTROBAN ) 2 % Apply 1 Application topically 2 (two) times daily. 09/01/22   Wilhemena Harbour, NP  tizanidine  (ZANAFLEX ) 2 MG capsule Take 1 capsule (2 mg total) by mouth at bedtime as needed for muscle spasms. 07/01/20   Wurst, [place], PA-C  triamcinolone  ointment (KENALOG ) 0.1 % Apply 1 Application topically 2 (two) times daily. 09/01/22   Wilhemena Harbour, NP    Family History Family History  Problem Relation Age  of Onset   Cirrhosis Brother    Lung cancer Brother    Colon cancer Mother     Social History Social History   Tobacco Use   Smoking status: Never   Smokeless tobacco: Never  Substance Use Topics   Alcohol use: No   Drug use: No     Allergies   Morphine, Morphine and codeine, Penicillins, Shellfish allergy, Sulfa antibiotics, and Codeine   Review of Systems Review of Systems Per HPI  Physical Exam Triage Vital Signs ED Triage Vitals [08/18/23 1831]  Encounter Vitals Group     BP (!) 202/94     Systolic BP Percentile      Diastolic BP Percentile      Pulse Rate 77     Resp 14     Temp 98 F (36.7 C)     Temp Source Oral     SpO2 93 %     Weight      Height      Head Circumference      Peak Flow      Pain Score 8     Pain Loc      Pain Education      Exclude from Growth Chart    No data found.  Updated Vital Signs BP (!) 220/124 (BP Location: Left Arm)   Pulse 77   Temp 98 F (36.7 C) (Oral)   Resp  14   SpO2 93%   Visual Acuity Right Eye Distance:   Left Eye Distance:   Bilateral Distance:    Right Eye Near:   Left Eye Near:    Bilateral Near:     Physical Exam Vitals and nursing note reviewed.  Constitutional:      Appearance: Normal appearance. She is not ill-appearing.  HENT:     Head: Atraumatic.  Eyes:     Extraocular Movements: Extraocular movements intact.     Conjunctiva/sclera: Conjunctivae normal.  Cardiovascular:     Rate and Rhythm: Normal rate.  Pulmonary:     Effort: Pulmonary effort is normal.  Musculoskeletal:        General: Normal range of motion.     Cervical back: Normal range of motion and neck supple.  Skin:    General: Skin is warm and dry.     Findings: Erythema present.     Comments: Erythematous papular lesion to the right side of neck at site of tick bite  Neurological:     Mental Status: She is alert and oriented to person, place, and time. Mental status is at baseline.  Psychiatric:        Mood and Affect: Mood normal.        Thought Content: Thought content normal.        Judgment: Judgment normal.      UC Treatments / Results  Labs (all labs ordered are listed, but only abnormal results are displayed) Labs Reviewed - No data to display  EKG   Radiology No results found.  Procedures Procedures (including critical care time)  Medications Ordered in UC Medications - No data to display  Initial Impression / Assessment and Plan / UC Course  I have reviewed the triage vital signs and the nursing notes.  Pertinent labs & imaging results that were available during my care of the patient were reviewed by me and considered in my medical decision making (see chart for details).     Continue triamcinolone  cream for allergic site reaction from tick bite.  Discussed  with patient that tickborne illness would not show symptoms or test positive for at least 1 to 2 weeks after tick bite so to continue to monitor for these things but  that that is not a concern at this time.  Of concern at this time is her significantly elevated blood pressure reading, including on recheck at the end of visit.  She states she is completely asymptomatic in this way right now with no chest pain, shortness of breath, visual change, dizziness, mental status changes.  She states her blood pressures do typically run elevated in the 160s to 170s over 80s to 90s and that she is to take half metoprolol twice daily.  She does not recall missing any doses.  Discussed to monitor closely at home, continue medication as prescribed for now and follow-up with PCP in the morning.  ED for worsening symptoms at any time.  Final Clinical Impressions(s) / UC Diagnoses   Final diagnoses:  Hypertensive urgency  Tick bite of other part of neck, initial encounter     Discharge Instructions      As discussed, I am concerned about how elevated your blood pressure is.  Take your medication when you get home as prescribed and continue keeping track on it.  Call your primary care provider if still elevated by tomorrow.  For any worsening symptoms at any time go to the emergency department.  ED Prescriptions     Medication Sig Dispense Auth. Provider   triamcinolone  cream (KENALOG ) 0.1 % Apply 1 Application topically 2 (two) times daily. 80 g Corbin Dess, [place]      PDMP not reviewed this encounter.   Corbin Dess, [place] 08/21/23 [phone]

## 2023-08-22 DIAGNOSIS — E039 Hypothyroidism, unspecified: Secondary | ICD-10-CM | POA: Diagnosis not present

## 2023-08-22 DIAGNOSIS — E559 Vitamin D deficiency, unspecified: Secondary | ICD-10-CM | POA: Diagnosis not present

## 2023-08-22 DIAGNOSIS — R7309 Other abnormal glucose: Secondary | ICD-10-CM | POA: Diagnosis not present

## 2023-08-22 DIAGNOSIS — A6920 Lyme disease, unspecified: Secondary | ICD-10-CM | POA: Diagnosis not present

## 2023-08-22 DIAGNOSIS — I10 Essential (primary) hypertension: Secondary | ICD-10-CM | POA: Diagnosis not present

## 2023-08-22 DIAGNOSIS — E6609 Other obesity due to excess calories: Secondary | ICD-10-CM | POA: Diagnosis not present

## 2023-08-22 DIAGNOSIS — E1159 Type 2 diabetes mellitus with other circulatory complications: Secondary | ICD-10-CM | POA: Diagnosis not present

## 2023-08-22 DIAGNOSIS — Z6837 Body mass index (BMI) 37.0-37.9, adult: Secondary | ICD-10-CM | POA: Diagnosis not present

## 2023-08-22 DIAGNOSIS — A260 Cutaneous erysipeloid: Secondary | ICD-10-CM | POA: Diagnosis not present

## 2023-09-12 DIAGNOSIS — E039 Hypothyroidism, unspecified: Secondary | ICD-10-CM | POA: Diagnosis not present

## 2023-09-12 DIAGNOSIS — E1159 Type 2 diabetes mellitus with other circulatory complications: Secondary | ICD-10-CM | POA: Diagnosis not present

## 2023-09-12 DIAGNOSIS — Z6836 Body mass index (BMI) 36.0-36.9, adult: Secondary | ICD-10-CM | POA: Diagnosis not present

## 2023-09-12 DIAGNOSIS — E6609 Other obesity due to excess calories: Secondary | ICD-10-CM | POA: Diagnosis not present

## 2023-09-12 DIAGNOSIS — E782 Mixed hyperlipidemia: Secondary | ICD-10-CM | POA: Diagnosis not present

## 2023-09-12 DIAGNOSIS — I10 Essential (primary) hypertension: Secondary | ICD-10-CM | POA: Diagnosis not present

## 2023-09-13 DIAGNOSIS — R0902 Hypoxemia: Secondary | ICD-10-CM | POA: Diagnosis not present

## 2023-10-13 DIAGNOSIS — R0902 Hypoxemia: Secondary | ICD-10-CM | POA: Diagnosis not present

## 2023-11-13 DIAGNOSIS — R0902 Hypoxemia: Secondary | ICD-10-CM | POA: Diagnosis not present

## 2023-12-14 DIAGNOSIS — R0902 Hypoxemia: Secondary | ICD-10-CM | POA: Diagnosis not present

## 2023-12-29 DIAGNOSIS — E119 Type 2 diabetes mellitus without complications: Secondary | ICD-10-CM | POA: Diagnosis not present

## 2023-12-29 DIAGNOSIS — Z6837 Body mass index (BMI) 37.0-37.9, adult: Secondary | ICD-10-CM | POA: Diagnosis not present

## 2023-12-29 DIAGNOSIS — E559 Vitamin D deficiency, unspecified: Secondary | ICD-10-CM | POA: Diagnosis not present

## 2023-12-29 DIAGNOSIS — E785 Hyperlipidemia, unspecified: Secondary | ICD-10-CM | POA: Diagnosis not present

## 2023-12-29 DIAGNOSIS — I10 Essential (primary) hypertension: Secondary | ICD-10-CM | POA: Diagnosis not present

## 2023-12-29 DIAGNOSIS — E039 Hypothyroidism, unspecified: Secondary | ICD-10-CM | POA: Diagnosis not present

## 2024-01-13 DIAGNOSIS — R0902 Hypoxemia: Secondary | ICD-10-CM | POA: Diagnosis not present

## 2024-02-13 DIAGNOSIS — R0902 Hypoxemia: Secondary | ICD-10-CM | POA: Diagnosis not present

## 2024-03-14 DIAGNOSIS — R0902 Hypoxemia: Secondary | ICD-10-CM | POA: Diagnosis not present
